# Patient Record
Sex: Female | Born: 1960 | Race: White | Hispanic: No | Marital: Married | Smoking: Former smoker
Health system: Southern US, Community
[De-identification: ages and names within clinical notes are randomized; demographics above are authoritative.]

## PROBLEM LIST (undated history)

## (undated) DIAGNOSIS — G43909 Migraine, unspecified, not intractable, without status migrainosus: Secondary | ICD-10-CM

## (undated) DIAGNOSIS — M797 Fibromyalgia: Secondary | ICD-10-CM

## (undated) DIAGNOSIS — E039 Hypothyroidism, unspecified: Secondary | ICD-10-CM

## (undated) DIAGNOSIS — E785 Hyperlipidemia, unspecified: Secondary | ICD-10-CM

## (undated) DIAGNOSIS — J3089 Other allergic rhinitis: Secondary | ICD-10-CM

## (undated) DIAGNOSIS — Z862 Personal history of diseases of the blood and blood-forming organs and certain disorders involving the immune mechanism: Secondary | ICD-10-CM

## (undated) DIAGNOSIS — I10 Essential (primary) hypertension: Secondary | ICD-10-CM

## (undated) DIAGNOSIS — Z8639 Personal history of other endocrine, nutritional and metabolic disease: Secondary | ICD-10-CM

## (undated) HISTORY — DX: Essential (primary) hypertension: I10

## (undated) HISTORY — PX: TUBAL LIGATION: SHX77

## (undated) HISTORY — DX: Hypothyroidism, unspecified: E03.9

## (undated) HISTORY — DX: Personal history of diseases of the blood and blood-forming organs and certain disorders involving the immune mechanism: Z86.2

## (undated) HISTORY — DX: Fibromyalgia: M79.7

## (undated) HISTORY — PX: ABDOMINAL HYSTERECTOMY: SHX81

## (undated) HISTORY — DX: Other allergic rhinitis: J30.89

## (undated) HISTORY — DX: Hyperlipidemia, unspecified: E78.5

## (undated) HISTORY — DX: Migraine, unspecified, not intractable, without status migrainosus: G43.909

## (undated) HISTORY — DX: Personal history of other endocrine, nutritional and metabolic disease: Z86.39

---

## 1987-11-30 HISTORY — PX: CHOLECYSTECTOMY: SHX55

## 2005-11-29 DIAGNOSIS — Z862 Personal history of diseases of the blood and blood-forming organs and certain disorders involving the immune mechanism: Secondary | ICD-10-CM

## 2005-11-29 HISTORY — DX: Personal history of diseases of the blood and blood-forming organs and certain disorders involving the immune mechanism: Z86.2

## 2005-11-29 HISTORY — PX: LAPAROSCOPIC HYSTERECTOMY: SHX1926

## 2010-11-29 DIAGNOSIS — M797 Fibromyalgia: Secondary | ICD-10-CM

## 2010-11-29 HISTORY — DX: Fibromyalgia: M79.7

## 2012-08-23 ENCOUNTER — Encounter: Payer: Self-pay | Admitting: Family Medicine

## 2012-08-23 ENCOUNTER — Other Ambulatory Visit: Payer: Self-pay | Admitting: Family Medicine

## 2012-08-23 ENCOUNTER — Ambulatory Visit (INDEPENDENT_AMBULATORY_CARE_PROVIDER_SITE_OTHER): Payer: PRIVATE HEALTH INSURANCE | Admitting: Family Medicine

## 2012-08-23 VITALS — BP 144/90 | HR 96 | Temp 98.2°F | Ht 66.5 in | Wt 248.0 lb

## 2012-08-23 DIAGNOSIS — E039 Hypothyroidism, unspecified: Secondary | ICD-10-CM

## 2012-08-23 DIAGNOSIS — I10 Essential (primary) hypertension: Secondary | ICD-10-CM

## 2012-08-23 DIAGNOSIS — D571 Sickle-cell disease without crisis: Secondary | ICD-10-CM

## 2012-08-23 DIAGNOSIS — IMO0001 Reserved for inherently not codable concepts without codable children: Secondary | ICD-10-CM

## 2012-08-23 DIAGNOSIS — G43909 Migraine, unspecified, not intractable, without status migrainosus: Secondary | ICD-10-CM | POA: Insufficient documentation

## 2012-08-23 DIAGNOSIS — J309 Allergic rhinitis, unspecified: Secondary | ICD-10-CM

## 2012-08-23 DIAGNOSIS — M797 Fibromyalgia: Secondary | ICD-10-CM

## 2012-08-23 DIAGNOSIS — D649 Anemia, unspecified: Secondary | ICD-10-CM | POA: Insufficient documentation

## 2012-08-23 DIAGNOSIS — J3089 Other allergic rhinitis: Secondary | ICD-10-CM

## 2012-08-23 DIAGNOSIS — E1165 Type 2 diabetes mellitus with hyperglycemia: Secondary | ICD-10-CM

## 2012-08-23 LAB — MICROALBUMIN / CREATININE URINE RATIO
Creatinine,U: 216.8 mg/dL
Microalb Creat Ratio: 4.4 mg/g (ref 0.0–30.0)
Microalb, Ur: 9.5 mg/dL — ABNORMAL HIGH (ref 0.0–1.9)

## 2012-08-23 LAB — BASIC METABOLIC PANEL
GFR: 89.58 mL/min (ref >60.00)
Glucose, Bld: 204 mg/dL — ABNORMAL HIGH (ref 70–99)
Potassium: 4.1 mEq/L (ref 3.5–5.1)
Sodium: 139 mEq/L (ref 135–145)

## 2012-08-23 LAB — HEMOGLOBIN A1C: Hgb A1c MFr Bld: 7.4 % — ABNORMAL HIGH (ref 4.6–6.5)

## 2012-08-23 MED ORDER — METFORMIN HCL 500 MG PO TABS: 500.0000 mg | ORAL_TABLET | Freq: Every day | ORAL | Status: DC

## 2012-08-23 NOTE — Assessment & Plan Note (Signed)
Check A1c today as well as microalb today.   Requested records today. Foot exam today. Encouraged to schedule vision screen. I've asked her to keep track of blood sugars for 1 week prior to next appt and bring log in.

## 2012-08-23 NOTE — Patient Instructions (Signed)
Good to meet you today. Call us with questions. Return in 1 month for physical. Make sure you sign release of info for records from [place]. Blood work today.

## 2012-08-23 NOTE — Assessment & Plan Note (Signed)
Await records.  ??dx.

## 2012-08-23 NOTE — Assessment & Plan Note (Signed)
Stable off meds. Tried cymbalta in past. Await records from prior provider.

## 2012-08-23 NOTE — Assessment & Plan Note (Signed)
Check TSH today

## 2012-08-23 NOTE — Assessment & Plan Note (Signed)
Await records from prior PCP. 

## 2012-08-23 NOTE — Assessment & Plan Note (Signed)
Chronic, stable. Continue lisinopril. Pt states at home significantly better control than in office, endorses white coat hypertension. No changes today.

## 2012-08-23 NOTE — Progress Notes (Signed)
Subjective:    Patient ID: Cheryl Brady, female    DOB: 07-30-1961, 51 y.o.   MRN: [id number]  HPI CC: new pt to establish  Moved 5 wks ago, from [place] [employer]  HTN - tolerate lisinopril 20mg  daily - uses brand.  At home bp runs 110/70s.  Hypothyroidism - on synthroid brand daily.  Dx 2009.  Silent migraines - dizzy and nauseated, photophobia.  Treats alka seltzer.  Gets 1x/wk.  Fibromyalgia - currently off meds.  Tried cymbalta.  DM - dx 2006.  Recently moved so hasn't been checking sugars.  Was running 140s fasting.  Last eye exam - about 4 years.  No h/o foot exam in past.  Preventative: Last CPE 4 yrs ago. Mammogram - 3 yrs ago, normal. Declines flu shot. Tetanus shot - about 2001.  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  Past histories reviewed and updated if relevant as below. Patient Active Problem List  Diagnosis  . Sickle cell anemia  . Diabetes type 2, uncontrolled  . Perennial allergic rhinitis  . Hypertension  . Migraines  . Hypothyroid  . Fibromyalgia   Past Medical History  Diagnosis Date  . Sickle cell anemia 2007    dx at hysterectomy (per pt)  . Diabetes type 2, uncontrolled ~2006  . Perennial allergic rhinitis   . Hypertension   . Migraines     silent migraines - dizzy and nauseated, photophobia  . Hypothyroid   . Fibromyalgia    Past Surgical History  Procedure Date  . Cholecystectomy 1989  . Laparoscopic hysterectomy 2007    total, BSO  . Tubal ligation    History  Substance Use Topics  . Smoking status: Former Games developer  . Smokeless tobacco: Never Used  . Alcohol Use: No   Family History  Problem Relation Age of Onset  . Stroke Mother   . Coronary artery disease Father   . Stroke Father    . Diabetes Neg Hx   . Cancer Neg Hx    Allergies  Allergen Reactions  . Cymbalta (Duloxetine Hcl) Nausea Only  . Adhesive (Tape) Rash  . Latex Rash   Current Outpatient Prescriptions on File Prior to Visit  Medication Sig Dispense Refill  . levothyroxine (SYNTHROID, LEVOTHROID) 125 MCG tablet Take 125 mcg by mouth daily.      Marland Kitchen lisinopril (PRINIVIL,ZESTRIL) 20 MG tablet Take 20 mg by mouth daily.        Review of Systems  Constitutional: Negative for fever, chills, activity change, appetite change, fatigue and unexpected weight change.  HENT: Negative for hearing loss and neck pain.   Eyes: Negative for visual disturbance.  Respiratory: Positive for cough and wheezing. Negative for chest tightness and shortness of breath.   Cardiovascular: Negative for chest pain, palpitations and leg swelling.  Gastrointestinal: Positive for abdominal pain (LLQ). Negative for nausea, vomiting, diarrhea, constipation, blood in stool and abdominal distention.  Genitourinary: Negative for hematuria and difficulty urinating.  Musculoskeletal: Negative for myalgias and arthralgias.  Skin: Negative for rash.  Neurological: Positive for headaches. Negative for dizziness, seizures and syncope.  Hematological: Does not bruise/bleed easily.  Psychiatric/Behavioral: Negative for dysphoric mood. The patient is not nervous/anxious.        Objective:   Physical Exam  Nursing note and vitals reviewed. Constitutional: She is oriented  to person, place, and time. She appears well-developed and well-nourished. No distress.  HENT:  Head: Normocephalic and atraumatic.  Right Ear: External ear normal.  Left Ear: External ear normal.  Nose: Nose normal.  Mouth/Throat: Oropharynx is clear and moist. No oropharyngeal exudate.  Eyes: Conjunctivae normal and EOM are normal. Pupils are equal, round, and reactive to light. No scleral icterus.  Neck: Normal range of motion. Neck supple. Carotid bruit is not present.  No thyromegaly present.  Cardiovascular: Normal rate, regular rhythm, normal heart sounds and intact distal pulses.   No murmur heard. Pulses:      Radial pulses are 2+ on the right side, and 2+ on the left side.  Pulmonary/Chest: Effort normal and breath sounds normal. No respiratory distress. She has no wheezes. She has no rales.  Abdominal: Soft. Bowel sounds are normal. She exhibits no distension and no mass. There is no tenderness. There is no rebound and no guarding.  Musculoskeletal: Normal range of motion. She exhibits no edema.       Diabetic foot exam: Normal inspection No skin breakdown No calluses  Normal DP/PT pulses Normal sensation to light touch and monofilament Nails normal   Lymphadenopathy:    She has no cervical adenopathy.  Neurological: She is alert and oriented to person, place, and time.       CN grossly intact, station and gait intact  Skin: Skin is warm and dry. No rash noted.  Psychiatric: She has a normal mood and affect. Her behavior is normal. Judgment and thought content normal.      Assessment & Plan:

## 2012-09-21 ENCOUNTER — Encounter: Payer: Self-pay | Admitting: Family Medicine

## 2012-09-21 ENCOUNTER — Ambulatory Visit (INDEPENDENT_AMBULATORY_CARE_PROVIDER_SITE_OTHER): Payer: PRIVATE HEALTH INSURANCE | Admitting: Family Medicine

## 2012-09-21 VITALS — BP 136/98 | HR 76 | Temp 98.2°F | Ht 66.5 in | Wt 243.2 lb

## 2012-09-21 DIAGNOSIS — Z1231 Encounter for screening mammogram for malignant neoplasm of breast: Secondary | ICD-10-CM

## 2012-09-21 DIAGNOSIS — I10 Essential (primary) hypertension: Secondary | ICD-10-CM

## 2012-09-21 DIAGNOSIS — E1165 Type 2 diabetes mellitus with hyperglycemia: Secondary | ICD-10-CM

## 2012-09-21 DIAGNOSIS — IMO0002 Reserved for concepts with insufficient information to code with codable children: Secondary | ICD-10-CM

## 2012-09-21 DIAGNOSIS — E669 Obesity, unspecified: Secondary | ICD-10-CM

## 2012-09-21 DIAGNOSIS — E039 Hypothyroidism, unspecified: Secondary | ICD-10-CM

## 2012-09-21 DIAGNOSIS — D649 Anemia, unspecified: Secondary | ICD-10-CM

## 2012-09-21 DIAGNOSIS — Z1211 Encounter for screening for malignant neoplasm of colon: Secondary | ICD-10-CM

## 2012-09-21 DIAGNOSIS — Z0000 Encounter for general adult medical examination without abnormal findings: Secondary | ICD-10-CM

## 2012-09-21 DIAGNOSIS — Z23 Encounter for immunization: Secondary | ICD-10-CM

## 2012-09-21 LAB — CBC WITH DIFFERENTIAL/PLATELET
Basophils Relative: 0.4 % (ref 0.0–3.0)
Eosinophils Relative: 3.3 % (ref 0.0–5.0)
HCT: 48.1 % — ABNORMAL HIGH (ref 36.0–46.0)
Hemoglobin: 16 g/dL — ABNORMAL HIGH (ref 12.0–15.0)
Lymphs Abs: 2.9 10*3/uL (ref 0.7–4.0)
MCV: 90.7 fl (ref 78.0–100.0)
Monocytes Absolute: 0.8 10*3/uL (ref 0.1–1.0)
Neutro Abs: 5.7 10*3/uL (ref 1.4–7.7)
RBC: 5.3 Mil/uL — ABNORMAL HIGH (ref 3.87–5.11)
WBC: 9.7 10*3/uL (ref 4.5–10.5)

## 2012-09-21 LAB — LIPID PANEL: Triglycerides: 171 mg/dL — ABNORMAL HIGH (ref 0.0–149.0)

## 2012-09-21 MED ORDER — GLUCOPHAGE XR 500 MG PO TB24: 500.0000 mg | ORAL_TABLET | Freq: Every day | ORAL | Status: DC

## 2012-09-21 NOTE — Assessment & Plan Note (Signed)
Chronic. Did not tolerate IR metformin.  Trial of XR variety. rtc 3-4 mo for f/u DM.

## 2012-09-21 NOTE — Assessment & Plan Note (Signed)
Preventative protocols reviewed and updated unless pt declined. Discussed healthy diet and lifestyle.  

## 2012-09-21 NOTE — Assessment & Plan Note (Signed)
Chronic. Elevated today.  Anticipate improved control with weight loss - continue to monitor.  Endorses white coat HTN.

## 2012-09-21 NOTE — Assessment & Plan Note (Signed)
Awaiting records.  Check CBC today.

## 2012-09-21 NOTE — Progress Notes (Signed)
Subjective:    Patient ID: Cheryl Brady, female    DOB: March 20, 1961, 51 y.o.   MRN: [id number]  HPI CC: CPE  DM - did not tolerate metformin IR so will try extended release metformin.  Sugars averaging 100-150s.  Checks twice daily, fasting and prior to bedtime.  No hypoglycemia. Lab Results  Component Value Date   HGBA1C 7.4* 08/23/2012   Wt Readings from Last 3 Encounters:  09/21/12 243 lb 4 oz (110.337 kg)  08/23/12 248 lb (112.492 kg)  5lb weight loss noted!  staying more active, healthier diet.  Preventative:  Mammogram - 3 yrs ago, normal.  Would like [place] to schedule in 2014. Colon cancer - would like colonoscopy scheduled in 2014. Declines flu shot. Declines pneumovax. Tetanus shot - would like today.  Tdap. Well woman - sees Dr. Tawanna Cooler for OBGYN.  Has had normal breast exam/pap in past.  Sunscreen use discussed. Seatbelt use discussed.  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  Past histories reviewed and updated if relevant as below. Patient Active Problem List  Diagnosis  . Anemia  . Diabetes type 2, uncontrolled  . Perennial allergic rhinitis  . Hypertension  . Migraines  . Hypothyroid  . Fibromyalgia  . Healthcare maintenance   Past Medical History  Diagnosis Date  . Sickle cell anemia 2007    dx at hysterectomy (per pt)  . Diabetes type 2, uncontrolled ~2006  . Perennial allergic rhinitis   . Hypertension   . Migraines     silent migraines - dizzy and nauseated, photophobia  . Hypothyroid   . Fibromyalgia    Past Surgical History  Procedure Date  . Cholecystectomy 1989  . Laparoscopic hysterectomy 2007    total, BSO, for heavy bleeding  . Tubal ligation    History  Substance Use Topics  . Smoking status: Former Games developer  . Smokeless  tobacco: Never Used  . Alcohol Use: No   Family History  Problem Relation Age of Onset  . Stroke Mother   . Coronary artery disease Father   . Stroke Father   . Diabetes Neg Hx   . Cancer Neg Hx    Allergies  Allergen Reactions  . Cymbalta (Duloxetine Hcl) Nausea Only  . Metformin And Related     Nausea and diarrhea  . Adhesive (Tape) Rash  . Latex Rash   Current Outpatient Prescriptions on File Prior to Visit  Medication Sig Dispense Refill  . levothyroxine (SYNTHROID, LEVOTHROID) 125 MCG tablet Take 125 mcg by mouth daily.      Marland Kitchen lisinopril (PRINIVIL,ZESTRIL) 20 MG tablet Take 20 mg by mouth daily.         Review of Systems  Constitutional: Negative for fever, chills, activity change, appetite change, fatigue and unexpected weight change.  HENT: Negative for hearing loss and neck pain.   Eyes: Negative for visual disturbance.  Respiratory: Negative for cough, chest tightness, shortness of breath and wheezing.   Cardiovascular: Negative for chest pain, palpitations and leg swelling.  Gastrointestinal: Negative for nausea, vomiting, abdominal pain, diarrhea, constipation, blood in stool and abdominal distention.  Genitourinary: Negative for hematuria and difficulty urinating.  Musculoskeletal: Negative for myalgias and arthralgias.  Skin: Negative for rash.  Neurological: Negative for dizziness, seizures and syncope.  Hematological: Does not bruise/bleed easily.  Psychiatric/Behavioral: Negative for dysphoric mood. The patient  is not nervous/anxious.        Objective:   Physical Exam  Nursing note and vitals reviewed. Constitutional: She is oriented to person, place, and time. She appears well-developed and well-nourished. No distress.  HENT:  Head: Normocephalic and atraumatic.  Right Ear: External ear normal.  Left Ear: External ear normal.  Nose: Nose normal.  Mouth/Throat: Oropharynx is clear and moist. No oropharyngeal exudate.  Eyes: Conjunctivae normal and  EOM are normal. Pupils are equal, round, and reactive to light. No scleral icterus.  Neck: Normal range of motion. Neck supple.  Cardiovascular: Normal rate, regular rhythm, normal heart sounds and intact distal pulses.   No murmur heard. Pulses:      Radial pulses are 2+ on the right side, and 2+ on the left side.  Pulmonary/Chest: Effort normal and breath sounds normal. No respiratory distress. She has no wheezes. She has no rales.       deferred  Abdominal: Soft. Bowel sounds are normal. She exhibits no distension and no mass. There is no tenderness. There is no rebound and no guarding.  Genitourinary:       deferred  Musculoskeletal: Normal range of motion. She exhibits no edema.  Lymphadenopathy:    She has no cervical adenopathy.  Neurological: She is alert and oriented to person, place, and time.       CN grossly intact, station and gait intact  Skin: Skin is warm and dry. No rash noted.  Psychiatric: She has a normal mood and affect. Her behavior is normal. Judgment and thought content normal.       Assessment & Plan:

## 2012-09-21 NOTE — Addendum Note (Signed)
Addended by: Josph Macho A on: 09/21/2012 11:52 AM   Modules accepted: Orders

## 2012-09-21 NOTE — Patient Instructions (Addendum)
Try extended release metformin xr 500mg  daily.  If unable to tolerate, let me know. Pass by Marion's office to schedule mammogram and colonoscopy after first of the new year. Tdap today. Blood work today to check cholesterol and blood counts. Schedule well woman exam with obgyn if due.

## 2012-09-21 NOTE — Assessment & Plan Note (Signed)
Good control on current dose. Continue. Lab Results  Component Value Date   TSH 3.32 08/23/2012

## 2012-09-21 NOTE — Assessment & Plan Note (Signed)
Congratulated on weight loss, continue to encourage this through healthy diet/lifestyle changes.

## 2012-09-22 ENCOUNTER — Encounter: Payer: Self-pay | Admitting: *Deleted

## 2012-09-22 ENCOUNTER — Encounter: Payer: Self-pay | Admitting: Family Medicine

## 2012-09-22 DIAGNOSIS — E785 Hyperlipidemia, unspecified: Secondary | ICD-10-CM | POA: Insufficient documentation

## 2012-11-23 ENCOUNTER — Other Ambulatory Visit: Payer: Self-pay

## 2012-11-23 MED ORDER — LISINOPRIL 20 MG PO TABS: 20.0000 mg | ORAL_TABLET | Freq: Every day | ORAL | Status: DC

## 2012-11-23 NOTE — Telephone Encounter (Signed)
pts husband request refill lisinopril 20 mg. Advised done #30 x 6 to [facility] [address].

## 2013-01-09 ENCOUNTER — Encounter: Payer: Self-pay | Admitting: Gastroenterology

## 2013-01-22 ENCOUNTER — Ambulatory Visit: Payer: PRIVATE HEALTH INSURANCE | Admitting: Family Medicine

## 2013-01-22 DIAGNOSIS — Z0289 Encounter for other administrative examinations: Secondary | ICD-10-CM

## 2013-01-25 ENCOUNTER — Other Ambulatory Visit: Payer: Self-pay

## 2013-01-25 MED ORDER — LEVOTHYROXINE SODIUM 125 MCG PO TABS: 125.0000 ug | ORAL_TABLET | Freq: Every day | ORAL | Status: DC

## 2013-01-25 NOTE — Telephone Encounter (Signed)
pts husband request refill Levothyroxine # 90 x 1 to [facility] [address]. Advised done.

## 2013-02-14 ENCOUNTER — Other Ambulatory Visit: Payer: PRIVATE HEALTH INSURANCE | Admitting: Gastroenterology

## 2013-03-27 ENCOUNTER — Other Ambulatory Visit: Payer: PRIVATE HEALTH INSURANCE | Admitting: Gastroenterology

## 2013-04-03 ENCOUNTER — Telehealth: Payer: Self-pay

## 2013-04-03 NOTE — Telephone Encounter (Signed)
Pt request refill on diabetic test strips,lisinopril and synthroid; pt is not out of med; pt was no show for 01/22/13 f/u visit. Pt scheduled appt on 04/06/13 at 3:45 pm.(1st appt convenient for pt schedule)the patient will bring meds with her to visit.

## 2013-04-06 ENCOUNTER — Ambulatory Visit (INDEPENDENT_AMBULATORY_CARE_PROVIDER_SITE_OTHER): Payer: PRIVATE HEALTH INSURANCE | Admitting: Family Medicine

## 2013-04-06 ENCOUNTER — Encounter: Payer: Self-pay | Admitting: Family Medicine

## 2013-04-06 VITALS — BP 132/84 | HR 68 | Temp 98.5°F | Wt 239.0 lb

## 2013-04-06 DIAGNOSIS — I10 Essential (primary) hypertension: Secondary | ICD-10-CM

## 2013-04-06 DIAGNOSIS — E669 Obesity, unspecified: Secondary | ICD-10-CM

## 2013-04-06 DIAGNOSIS — E1165 Type 2 diabetes mellitus with hyperglycemia: Secondary | ICD-10-CM

## 2013-04-06 DIAGNOSIS — E785 Hyperlipidemia, unspecified: Secondary | ICD-10-CM

## 2013-04-06 NOTE — Patient Instructions (Addendum)
[facility] with [facility] about eye exam. Pass by Marion's office to schedule diabetes education at [facility]. Start [place] 50mg  daily.  If tolerating well, after 2 weeks increase to 100mg  daily. Blood work today. Cholesterol levels were high last visit - we may need to discuss chol medication at follow up visit.

## 2013-04-06 NOTE — Progress Notes (Signed)
  Subjective:    Patient ID: Analia Zuk, female    DOB: Jun 22, 1961, 52 y.o.   MRN: [id number]  HPI CC: DM f/u  DM - did not tolerate metformin IR so tried extended release metformin - same trouble.  tried this for 2 weeks.  Checks twice daily, fasting and prior to bedtime. fasting 190s, PM 135.  No hypoglycemia sxs or low sugars.  Last eye exam 3+ yrs ago.  Foot exam - today.  Poor dietary choices.    Lab Results  Component Value Date   HGBA1C 7.4* 08/23/2012   Wt Readings from Last 3 Encounters:  04/06/13 239 lb (108.41 kg)  09/21/12 243 lb 4 oz (110.337 kg)  08/23/12 248 lb (112.492 kg)  Trying low carb diet. Looking into Y. Backed off walking 2/2 bugs.  HTN - No HA, vision changes, CP/tightness, SOB, leg swelling.  ALS- father, paternal aunt, and 2 cousins with ALS.  3rd cousin recently dx (age 29yo).  Pt is ex smoker (quit 15 yrs ago).  Past Medical History  Diagnosis Date  . Sickle cell anemia 2007    ??dx at hysterectomy (per pt)  likely just anemia, awaiting records.  . Diabetes type 2, uncontrolled ~2006  . Perennial allergic rhinitis   . Hypertension   . Migraines     silent migraines - dizzy and nauseated, photophobia  . Hypothyroid   . Fibromyalgia   . Hyperlipidemia     Review of Systems Per HPI    Objective:   Physical Exam  Nursing note and vitals reviewed. Constitutional: She appears well-developed and well-nourished. No distress.  HENT:  Head: Normocephalic and atraumatic.  Nose: Nose normal.  Mouth/Throat: Oropharynx is clear and moist. No oropharyngeal exudate.  Eyes: Conjunctivae and EOM are normal. Pupils are equal, round, and reactive to light. No scleral icterus.  Neck: Normal range of motion. Neck supple.  Cardiovascular: Normal rate, regular rhythm, normal heart sounds and intact distal pulses.   No murmur heard. Pulmonary/Chest: Effort normal and breath sounds normal. No respiratory distress. She has no wheezes. She has no rales.   Musculoskeletal: She exhibits no edema.  Diabetic foot exam: Normal inspection No skin breakdown No calluses  Normal DP/PT pulses Normal sensation to light touch and monofilament Nails normal  Lymphadenopathy:    She has no cervical adenopathy.  Skin: Skin is warm and dry. No rash noted.  Psychiatric: She has a normal mood and affect.      Assessment & Plan:

## 2013-04-06 NOTE — Assessment & Plan Note (Signed)
Uncontrolled as of last check , will need to discuss statin at f/u visit.

## 2013-04-06 NOTE — Assessment & Plan Note (Signed)
Chronic, stable. continue ACEI.

## 2013-04-06 NOTE — Assessment & Plan Note (Signed)
Congratulated on weight loss - keep up good work.

## 2013-04-06 NOTE — Assessment & Plan Note (Signed)
Chronic, uncontrolled. Did not tolerate extended release metformin. Start [place] 50mg  daily, increase to 100mg  if tolerated. rtc 3 mo for DM f/u. Set up eye exam. Foot exam today. Set up diabetes education at [facility].

## 2013-04-07 ENCOUNTER — Emergency Department: Payer: Self-pay | Admitting: Emergency Medicine

## 2013-04-07 LAB — BASIC METABOLIC PANEL
BUN: 13 mg/dL (ref 6–23)
CO2: 20 mEq/L (ref 19–32)
Calcium: 10.6 mg/dL — ABNORMAL HIGH (ref 8.4–10.5)
Chloride: 105 mEq/L (ref 96–112)
Creat: 0.75 mg/dL (ref 0.50–1.10)
Glucose, Bld: 134 mg/dL — ABNORMAL HIGH (ref 70–99)
Potassium: 4.1 mEq/L (ref 3.5–5.3)
Sodium: 140 mEq/L (ref 135–145)

## 2013-04-08 ENCOUNTER — Encounter: Payer: Self-pay | Admitting: Family Medicine

## 2013-04-09 ENCOUNTER — Encounter: Payer: Self-pay | Admitting: *Deleted

## 2013-04-09 ENCOUNTER — Telehealth: Payer: Self-pay

## 2013-04-09 ENCOUNTER — Encounter: Payer: Self-pay | Admitting: Family Medicine

## 2013-04-09 NOTE — Telephone Encounter (Signed)
Pt husband left v/m; pt seen 04/06/13 and was to call back name of glucose meter for refills Freestyle Freedom Lite. Pt request 90 day with 1 year refill to [facility]. Pt test twice daily.

## 2013-04-10 MED ORDER — GLUCOSE BLOOD VI STRP: 1.0000 | ORAL_STRIP | Freq: Every day | Status: DC

## 2013-04-10 MED ORDER — GLUCOSE BLOOD VI STRP: 1.0000 | ORAL_STRIP | Freq: Two times a day (BID) | Status: DC

## 2013-04-10 NOTE — Telephone Encounter (Signed)
Sent in as requested 

## 2013-04-13 ENCOUNTER — Telehealth: Payer: Self-pay

## 2013-04-13 MED ORDER — SITAGLIPTIN PHOSPHATE 50 MG PO TABS: 50.0000 mg | ORAL_TABLET | Freq: Every day | ORAL | Status: DC

## 2013-04-13 NOTE — Telephone Encounter (Signed)
pts husband left v/m Januvia prescription was to be sent to [facility] [address]; no rx at pharmacy.Please advise.

## 2013-04-13 NOTE — Telephone Encounter (Signed)
Informed pts husband

## 2013-04-13 NOTE — Telephone Encounter (Signed)
i'm sorry i didn't send in.  plz notify this has been sent in.

## 2013-04-25 ENCOUNTER — Other Ambulatory Visit: Payer: Self-pay | Admitting: Family Medicine

## 2013-04-25 MED ORDER — LEVOTHYROXINE SODIUM 125 MCG PO TABS: 125.0000 ug | ORAL_TABLET | Freq: Every day | ORAL | Status: DC

## 2013-04-27 ENCOUNTER — Telehealth: Payer: Self-pay

## 2013-04-27 DIAGNOSIS — E039 Hypothyroidism, unspecified: Secondary | ICD-10-CM

## 2013-04-27 NOTE — Telephone Encounter (Signed)
pts husband left v/m [facility] [address] does not have levothyroxine refill. Spoke with Hope at [facility] [address] and levothyroxine on hold but has 2 addresses for pt. Hope will call pt. I left v/m for Mr Music to advise also.

## 2013-05-02 NOTE — Telephone Encounter (Signed)
Ok to switch if pt agrees - but will need to come in 6 wks after switch and check TSH.  Placed order in chart.

## 2013-05-02 NOTE — Addendum Note (Signed)
Addended by: Eustaquio Boyden on: 05/02/2013 07:16 PM   Modules accepted: Orders

## 2013-05-02 NOTE — Telephone Encounter (Signed)
Pts husband said [facility] will not release levothyroxine; spoke with Neysa Bonito at [facility] [address] and issue of 2 addresses has been settled and pt has one acct now. Neysa Bonito said needs OK to switch pt from [employer] to [facility] brand. Please advise. Marcial Pacas, pt's husband request cb when done.

## 2013-05-03 ENCOUNTER — Other Ambulatory Visit: Payer: Self-pay

## 2013-05-03 MED ORDER — GLUCOPHAGE XR 500 MG PO TB24: 500.0000 mg | ORAL_TABLET | Freq: Every day | ORAL | Status: DC

## 2013-05-03 MED ORDER — LEVOTHYROXINE SODIUM 125 MCG PO TABS: 125.0000 ug | ORAL_TABLET | Freq: Every day | ORAL | Status: DC

## 2013-05-03 MED ORDER — SITAGLIPTIN PHOSPHATE 50 MG PO TABS: 50.0000 mg | ORAL_TABLET | Freq: Every day | ORAL | Status: DC

## 2013-05-03 MED ORDER — LISINOPRIL 20 MG PO TABS: 20.0000 mg | ORAL_TABLET | Freq: Every day | ORAL | Status: DC

## 2013-05-03 NOTE — Telephone Encounter (Signed)
Gave the okay to [facility] and notified pt's spouse, lab appt scheduled for 06/12/13 to check pt's TSH

## 2013-05-03 NOTE — Telephone Encounter (Signed)
Mr Windish request written rx for Glucophage, levothyroxine, lisinopril and Januvia manually faxed to [facility] fax # [phone]. Pt said [facility] mail order in our system is not where pts refills should go; glucose test strip order still in process. Mr Dhaliwal request cb when refills done.

## 2013-05-03 NOTE — Telephone Encounter (Signed)
Printed and placed in kim[facility] box 

## 2013-06-07 ENCOUNTER — Other Ambulatory Visit: Payer: Self-pay

## 2013-06-11 ENCOUNTER — Ambulatory Visit (INDEPENDENT_AMBULATORY_CARE_PROVIDER_SITE_OTHER): Payer: PRIVATE HEALTH INSURANCE | Admitting: Family Medicine

## 2013-06-11 ENCOUNTER — Other Ambulatory Visit: Payer: PRIVATE HEALTH INSURANCE

## 2013-06-11 ENCOUNTER — Encounter: Payer: Self-pay | Admitting: Family Medicine

## 2013-06-11 VITALS — BP 128/76 | HR 72 | Temp 98.0°F | Wt 243.8 lb

## 2013-06-11 DIAGNOSIS — E1165 Type 2 diabetes mellitus with hyperglycemia: Secondary | ICD-10-CM

## 2013-06-11 DIAGNOSIS — T148XXA Other injury of unspecified body region, initial encounter: Secondary | ICD-10-CM

## 2013-06-11 DIAGNOSIS — E039 Hypothyroidism, unspecified: Secondary | ICD-10-CM

## 2013-06-11 NOTE — Assessment & Plan Note (Signed)
Reassured.  Wrap with ace bandage in office. supportive care as per instructions Anticipate 4-6 wk course of resolution, update if not improving as expected

## 2013-06-11 NOTE — Progress Notes (Signed)
  Subjective:    Patient ID: Cheryl Brady, female    DOB: 1961-09-27, 52 y.o.   MRN: [id number]  HPI CC: L leg pain  Some issues with getting meds filled through [facility] in [place].  To start [place], just received med on Friday.  Does not have f/u appt for diabetes yet.  Endorses lots of stress eating - recent vacation and daughter's wedding.   L leg pain - 2 wks ago in pool, climbing out of ladder, hit anterior shin on ladder wrung.  Bruise noted anterior leg, spread to foot.  Swelling improving.  Has been using ice to leg.  Hypothyroidism - due for recheck today - missed 5 days but took 1/2 of husband's med. Lab Results  Component Value Date   TSH 3.32 08/23/2012     Past Medical History  Diagnosis Date  . Sickle cell anemia 2007    ??dx at hysterectomy (per pt)  likely just anemia, awaiting records.  . Diabetes type 2, uncontrolled ~2006    [facility] 03/2013  . Perennial allergic rhinitis   . Hypertension   . Migraines     silent migraines - dizzy and nauseated, photophobia  . Hypothyroid   . Fibromyalgia   . Hyperlipidemia      Review of Systems Per HPI    Objective:   Physical Exam  Nursing note and vitals reviewed. Constitutional: She appears well-developed and well-nourished. No distress.  Musculoskeletal: She exhibits no edema.  Diabetic foot exam: Normal inspection No skin breakdown No calluses  Normal DP/PT pulses Normal sensation to light touch and monofilament Nails normal  Resolving bruise on left anterior shin. Also tender at left lateral ankle but no ligament laxity.  Skin: Skin is warm and dry. No rash noted.  Bruising on anterior L shin       Assessment & Plan:

## 2013-06-11 NOTE — Assessment & Plan Note (Signed)
Just started [place] 50mg  daily. Lab Results  Component Value Date   HGBA1C 7.4* 04/06/2013  Foot exam today RTC 3 mo for DM f/u.

## 2013-06-11 NOTE — Assessment & Plan Note (Signed)
Check TSH today

## 2013-06-11 NOTE — Patient Instructions (Signed)
Thyroid check today. I think you had a bony bruise - will take several weeks to improve.  Continue to elevate and ice.  Compression bandage done today. May continue ibuprofen or tylenol as needed. Return in 3 months for diabetes follow up.

## 2013-06-12 ENCOUNTER — Other Ambulatory Visit: Payer: Self-pay | Admitting: Family Medicine

## 2013-06-12 ENCOUNTER — Other Ambulatory Visit: Payer: PRIVATE HEALTH INSURANCE

## 2013-06-12 MED ORDER — LEVOTHYROXINE SODIUM 137 MCG PO TABS: 137.0000 ug | ORAL_TABLET | Freq: Every day | ORAL | Status: DC

## 2013-07-19 ENCOUNTER — Other Ambulatory Visit (INDEPENDENT_AMBULATORY_CARE_PROVIDER_SITE_OTHER): Payer: PRIVATE HEALTH INSURANCE

## 2013-07-19 DIAGNOSIS — E039 Hypothyroidism, unspecified: Secondary | ICD-10-CM

## 2013-07-20 ENCOUNTER — Encounter: Payer: Self-pay | Admitting: *Deleted

## 2013-07-20 ENCOUNTER — Other Ambulatory Visit: Payer: Self-pay | Admitting: *Deleted

## 2013-07-20 MED ORDER — LEVOTHYROXINE SODIUM 137 MCG PO TABS: 137.0000 ug | ORAL_TABLET | Freq: Every day | ORAL | Status: DC

## 2013-07-30 LAB — HM DIABETES EYE EXAM

## 2013-09-12 ENCOUNTER — Encounter: Payer: Self-pay | Admitting: Family Medicine

## 2013-09-12 ENCOUNTER — Ambulatory Visit (INDEPENDENT_AMBULATORY_CARE_PROVIDER_SITE_OTHER): Payer: PRIVATE HEALTH INSURANCE | Admitting: Family Medicine

## 2013-09-12 ENCOUNTER — Ambulatory Visit (INDEPENDENT_AMBULATORY_CARE_PROVIDER_SITE_OTHER)
Admission: RE | Admit: 2013-09-12 | Discharge: 2013-09-12 | Disposition: A | Discharge status: Home / Self Care | Payer: PRIVATE HEALTH INSURANCE | Source: Ambulatory Visit | Attending: Family Medicine | Admitting: Family Medicine

## 2013-09-12 VITALS — BP 136/90 | HR 80 | Temp 98.9°F | Wt 243.8 lb

## 2013-09-12 DIAGNOSIS — E1165 Type 2 diabetes mellitus with hyperglycemia: Secondary | ICD-10-CM

## 2013-09-12 DIAGNOSIS — M79605 Pain in left leg: Secondary | ICD-10-CM

## 2013-09-12 DIAGNOSIS — M79609 Pain in unspecified limb: Secondary | ICD-10-CM

## 2013-09-12 DIAGNOSIS — I10 Essential (primary) hypertension: Secondary | ICD-10-CM

## 2013-09-12 DIAGNOSIS — T148XXA Other injury of unspecified body region, initial encounter: Secondary | ICD-10-CM

## 2013-09-12 LAB — BASIC METABOLIC PANEL
BUN: 13 mg/dL (ref 6–23)
Calcium: 9.9 mg/dL (ref 8.4–10.5)
GFR: 90.64 mL/min (ref >60.00)
Glucose, Bld: 219 mg/dL — ABNORMAL HIGH (ref 70–99)
Sodium: 139 mEq/L (ref 135–145)

## 2013-09-12 MED ORDER — GLIMEPIRIDE 1 MG PO TABS: 1.0000 mg | ORAL_TABLET | Freq: Every day | ORAL | Status: DC

## 2013-09-12 MED ORDER — GLIMEPIRIDE 2 MG PO TABS: 2.0000 mg | ORAL_TABLET | Freq: Every day | ORAL | Status: DC

## 2013-09-12 NOTE — Assessment & Plan Note (Signed)
Persistently uncontrolled - previously did not tolerate [place] or metformin. Start amaryl low dose - discussed possible hypoglycemia.  Advised monitor sugars closely with commencement. Declines flu and pneumovax today. Lab Results  Component Value Date   HGBA1C 7.4* 04/06/2013  rtc 3 mo for dm f/u.

## 2013-09-12 NOTE — Assessment & Plan Note (Signed)
Chronic, stable. Continue acei.

## 2013-09-12 NOTE — Patient Instructions (Signed)
Start glimeperide 1mg  daily for 2 weeks then may increase to 2mg  daily if sugars staying elevated.   Keep a close eye on sugars while we start this medicine because they can drop some. Blood work today.  Xray today. Return in 3-4 months for fasting blood work and afterwards for physical

## 2013-09-12 NOTE — Assessment & Plan Note (Signed)
Persistent pain, ?paresthesias? Check xray today to r/o stress fracture or other etiology.

## 2013-09-12 NOTE — Progress Notes (Signed)
  Subjective:    Patient ID: Cheryl Brady, female    DOB: [birth year], 52 y.o.   MRN: [id number]  HPI CC: DM f/u  DM - Sugars running fasting 250.  Checking once daily, occasionally more.  Did not tolerate immediate or extended release metformin, did not tolerate [place].  Last eye appt was last month, normal per patient.  Foot exam today.   Lab Results  Component Value Date   HGBA1C 7.4* 04/06/2013     L leg pain - 05/2013 climbing out of pool ladder, hit anterior shin on ladder wrung. Bruise noted anterior leg, spread to foot. Swelling improving. Thought she had bony bruise - rec supportive care.  Persistent anterior shin pain as well as some tingling and numbness of dorsal distal midfoot.  HTN - lisinopril 20mg  daily. Hypothyroid - compliant with levothyroxine. Lab Results  Component Value Date   TSH 4.01 07/19/2013    Declines flu shot. Declines pneumovax.  Has had pneumonia in the past.  Past Medical History  Diagnosis Date  . Sickle cell anemia 2007    ??dx at hysterectomy (per pt)  likely just anemia, awaiting records.  . Diabetes type 2, uncontrolled ~2006    DSME [facility] 03/2013  . Perennial allergic rhinitis   . Hypertension   . Migraines     silent migraines - dizzy and nauseated, photophobia  . Hypothyroid   . Fibromyalgia   . Hyperlipidemia     Review of Systems Per HPI    Objective:   Physical Exam  Nursing note and vitals reviewed. Constitutional: She appears well-developed and well-nourished. No distress.  HENT:  Head: Normocephalic and atraumatic.  Mouth/Throat: Oropharynx is clear and moist. No oropharyngeal exudate.  Eyes: Conjunctivae and EOM are normal. Pupils are equal, round, and reactive to light. No scleral icterus.  Neck: Normal range of motion. Neck supple.  Cardiovascular: Normal rate, regular rhythm, normal heart sounds and intact distal pulses.   No murmur heard. Pulmonary/Chest: Effort normal and breath sounds normal. No respiratory distress.  She has no wheezes. She has no rales.  Musculoskeletal: She exhibits no edema.  Diabetic foot exam: Normal inspection No skin breakdown No calluses  Normal DP/PT pulses Normal sensation to light touch and monofilament Nails normal  Tenderness anterior left lower leg with some bruising. Tender to squeeze mid calf. No ligament laxity of ankle  Lymphadenopathy:    She has no cervical adenopathy.  Skin: Skin is warm and dry. No rash noted.  Psychiatric: She has a normal mood and affect.       Assessment & Plan:

## 2013-09-14 ENCOUNTER — Other Ambulatory Visit: Payer: Self-pay | Admitting: Family Medicine

## 2013-09-14 MED ORDER — GLUCOSE BLOOD VI STRP: 1.0000 | ORAL_STRIP | Freq: Two times a day (BID) | Status: DC

## 2013-12-19 ENCOUNTER — Ambulatory Visit: Payer: PRIVATE HEALTH INSURANCE | Admitting: Family Medicine

## 2014-01-01 ENCOUNTER — Encounter: Payer: Self-pay | Admitting: Family Medicine

## 2014-01-01 ENCOUNTER — Ambulatory Visit (INDEPENDENT_AMBULATORY_CARE_PROVIDER_SITE_OTHER)
Admission: RE | Admit: 2014-01-01 | Discharge: 2014-01-01 | Disposition: A | Discharge status: Home / Self Care | Payer: PRIVATE HEALTH INSURANCE | Source: Ambulatory Visit | Attending: Family Medicine | Admitting: Family Medicine

## 2014-01-01 ENCOUNTER — Ambulatory Visit: Payer: PRIVATE HEALTH INSURANCE | Admitting: Family Medicine

## 2014-01-01 ENCOUNTER — Ambulatory Visit (INDEPENDENT_AMBULATORY_CARE_PROVIDER_SITE_OTHER): Payer: PRIVATE HEALTH INSURANCE | Admitting: Family Medicine

## 2014-01-01 VITALS — BP 128/82 | HR 72 | Temp 97.9°F | Wt 245.8 lb

## 2014-01-01 DIAGNOSIS — M79609 Pain in unspecified limb: Secondary | ICD-10-CM

## 2014-01-01 DIAGNOSIS — M7752 Other enthesopathy of left foot: Secondary | ICD-10-CM | POA: Insufficient documentation

## 2014-01-01 DIAGNOSIS — M79672 Pain in left foot: Secondary | ICD-10-CM

## 2014-01-01 DIAGNOSIS — IMO0001 Reserved for inherently not codable concepts without codable children: Secondary | ICD-10-CM

## 2014-01-01 DIAGNOSIS — IMO0002 Reserved for concepts with insufficient information to code with codable children: Secondary | ICD-10-CM

## 2014-01-01 DIAGNOSIS — E1165 Type 2 diabetes mellitus with hyperglycemia: Secondary | ICD-10-CM

## 2014-01-01 MED ORDER — CANAGLIFLOZIN 100 MG PO TABS: 100.0000 mg | ORAL_TABLET | Freq: Every day | ORAL | Status: DC

## 2014-01-01 NOTE — Progress Notes (Signed)
Pre-visit discussion using our clinic review tool. No additional management support is needed unless otherwise documented below in the visit note.  

## 2014-01-01 NOTE — Patient Instructions (Signed)
Xray of left foot today - I wonder about a stress fracture. Placed in post op shoe today - use for next 4 weeks. Trial of invokana 100mg  daily - sent in today.  We will fill out prior authorization for invokana for you. Return in 1 month for follow up foot and diabetes.

## 2014-01-01 NOTE — Assessment & Plan Note (Signed)
Remains persistently uncontrolled. Has not tolerated januvia, metformin, now amaryl. Start invokana at 100mg  daily.  Discussed possible UTI/fungal infection risk Discussed insulin - pt states she is scared of needles. RTC 1 mo f/u foot and DM.

## 2014-01-01 NOTE — Progress Notes (Signed)
   Subjective:    Patient ID: Cheryl Brady, female    DOB: May 15, 1961, 53 y.o.   MRN: [id number]  HPI CC: 3 mo f/u  DM - Checking twice daily.  Morning running 213, PM 150 before dinner.  Did not tolerate immediate or extended release metformin, did not tolerate [place], has not tolerated amaryl (glimeperide). Last eye appt was 07/2013, normal per patient. Denies paresthesias.  + tingling in LLE - see below. Lab Results  Component Value Date   HGBA1C 8.1* 09/12/2013    Had accident July 4th where she waw climbing out of pool - left foot got stuck in ladder coming up and pt fell backwards.  Walking worsens pain.  Bruise comes and goes dorsal mid foot.    HTN - Compliant with current antihypertensive regimen of lisinopril 20mg  daily.  Does check blood pressures at home: 120-130/80.  No low blood pressure readings or symptoms of dizziness/syncope.  Denies HA, vision changes, CP/tightness, SOB.  Past Medical History  Diagnosis Date  . Sickle cell anemia 2007    ??dx at hysterectomy (per pt)  likely just anemia, never received records, no current anemia.  . Diabetes type 2, uncontrolled ~2006    DSME [facility] 03/2013  . Perennial allergic rhinitis   . Hypertension   . Migraines     silent migraines - dizzy and nauseated, photophobia  . Hypothyroid   . Fibromyalgia   . Hyperlipidemia     Review of Systems Per HPI    Objective:   Physical Exam  Nursing note and vitals reviewed. Constitutional: She appears well-developed and well-nourished. No distress.  HENT:  Mouth/Throat: Oropharynx is clear and moist. No oropharyngeal exudate.  Cardiovascular: Normal rate, regular rhythm, normal heart sounds and intact distal pulses.   No murmur heard. Pulmonary/Chest: Effort normal and breath sounds normal. No respiratory distress. She has no wheezes. She has no rales.  Musculoskeletal: She exhibits no edema.  2+ DP bilaterally Tender and some swelling noted at 2nd/3rd mid MT shafts         Assessment & Plan:

## 2014-01-01 NOTE — Assessment & Plan Note (Signed)
Ongoing pain for last 7 months. Concern for MT stress fracture on left - check xray today and place in post op shoe to prevent bending at midfoot. rtc 1 mo for f/u.

## 2014-01-30 ENCOUNTER — Encounter: Payer: Self-pay | Admitting: Family Medicine

## 2014-01-30 ENCOUNTER — Ambulatory Visit (INDEPENDENT_AMBULATORY_CARE_PROVIDER_SITE_OTHER): Payer: PRIVATE HEALTH INSURANCE | Admitting: Family Medicine

## 2014-01-30 ENCOUNTER — Other Ambulatory Visit: Payer: Self-pay | Admitting: Family Medicine

## 2014-01-30 ENCOUNTER — Telehealth: Payer: Self-pay | Admitting: Family Medicine

## 2014-01-30 VITALS — BP 130/84 | HR 80 | Temp 97.9°F | Wt 243.8 lb

## 2014-01-30 DIAGNOSIS — E785 Hyperlipidemia, unspecified: Secondary | ICD-10-CM

## 2014-01-30 DIAGNOSIS — E1165 Type 2 diabetes mellitus with hyperglycemia: Secondary | ICD-10-CM

## 2014-01-30 DIAGNOSIS — E039 Hypothyroidism, unspecified: Secondary | ICD-10-CM

## 2014-01-30 DIAGNOSIS — M79672 Pain in left foot: Secondary | ICD-10-CM

## 2014-01-30 DIAGNOSIS — IMO0001 Reserved for inherently not codable concepts without codable children: Secondary | ICD-10-CM

## 2014-01-30 DIAGNOSIS — IMO0002 Reserved for concepts with insufficient information to code with codable children: Secondary | ICD-10-CM

## 2014-01-30 DIAGNOSIS — I10 Essential (primary) hypertension: Secondary | ICD-10-CM

## 2014-01-30 DIAGNOSIS — M79609 Pain in unspecified limb: Secondary | ICD-10-CM

## 2014-01-30 LAB — BASIC METABOLIC PANEL
BUN: 12 mg/dL (ref 6–23)
CHLORIDE: 106 meq/L (ref 96–112)
CO2: 25 mEq/L (ref 19–32)
Calcium: 9.2 mg/dL (ref 8.4–10.5)
Creatinine, Ser: 0.7 mg/dL (ref 0.4–1.2)
GFR: 89.07 mL/min (ref >60.00)
Glucose, Bld: 132 mg/dL — ABNORMAL HIGH (ref 70–99)
Potassium: 4.4 mEq/L (ref 3.5–5.1)
SODIUM: 140 meq/L (ref 135–145)

## 2014-01-30 LAB — LIPID PANEL
CHOL/HDL RATIO: 5
Cholesterol: 205 mg/dL — ABNORMAL HIGH (ref 0–200)
HDL: 44.2 mg/dL (ref >39.00)
LDL Cholesterol: 123 mg/dL — ABNORMAL HIGH (ref 0–99)
TRIGLYCERIDES: 187 mg/dL — AB (ref 0.0–149.0)
VLDL: 37.4 mg/dL (ref 0.0–40.0)

## 2014-01-30 LAB — TSH: TSH: 3.89 u[IU]/mL (ref 0.35–5.50)

## 2014-01-30 LAB — HEMOGLOBIN A1C: Hgb A1c MFr Bld: 7.8 % — ABNORMAL HIGH (ref 4.6–6.5)

## 2014-01-30 MED ORDER — CANAGLIFLOZIN 300 MG PO TABS: 300.0000 mg | ORAL_TABLET | Freq: Every day | ORAL | Status: DC

## 2014-01-30 NOTE — Assessment & Plan Note (Signed)
Anticipate improvement based on cbg's endorsed today. Continue invokana - may increase to 300mg  if A1c >7%.

## 2014-01-30 NOTE — Assessment & Plan Note (Signed)
Treated as presumed MT stress fracture, slowly improving. Continue post op shoe for next 2 weeks then try transition to regular shoe, discussed importance of supportive shoes.

## 2014-01-30 NOTE — Progress Notes (Signed)
Pre visit review using our clinic review tool, if applicable. No additional management support is needed unless otherwise documented below in the visit note. 

## 2014-01-30 NOTE — Patient Instructions (Signed)
2 more weeks on post op shoe then transition to supportive regular shoe. Continue meds as up to now. I think invokana is working well.

## 2014-01-30 NOTE — Telephone Encounter (Signed)
Relevant patient education assigned to patient using Emmi. ° °

## 2014-01-30 NOTE — Assessment & Plan Note (Signed)
Chronic, stable. Continue med. 

## 2014-01-30 NOTE — Assessment & Plan Note (Signed)
Recheck FLP today. 

## 2014-01-30 NOTE — Progress Notes (Signed)
BP 130/84  Pulse 80  Temp(Src) 97.9 F (36.6 C) (Oral)  Wt 243 lb 12 oz (110.564 kg)   CC:  1 Mo f/u  Subjective:    Patient ID: Cheryl Brady, female    DOB: Jun 07, 1961, 53 y.o.   MRN: [id number]  HPI: Cheryl Brady is a 53 y.o. female presenting on 01/30/2014 with Follow-up   DM - Checking twice daily. Highest 168, averaging 120-130.  Compliant with invokana 100mg  daily and tolerating well except for mild early morning nausea.  Did not tolerate immediate or extended release metformin, did not tolerate [place], has not tolerated amaryl (glimeperide). Last eye appt was 07/2013, normal per patient. Denies paresthesias.  HTN - Compliant with current antihypertensive regimen of lisinopril 20mg  daily. Does check blood pressures at home: 100-120/60-80. No low blood pressure readings or symptoms of dizziness/syncope. Denies HA, vision changes, CP/tightness, SOB.   Foot pain - Ongoing pain for last 8 months. Last visit concern for MT stress fracture on left - but xray ok, placed in post op shoe to prevent bending at midfoot.  Has used shoe for the last month - and noticed improvement.  Occasional advil.  [place]anning trip [place] at end of [place].  Relevant past medical, surgical, family and social history reviewed and updated as indicated.  Allergies and medications reviewed and updated. Current Outpatient Prescriptions on File Prior to Visit  Medication Sig  . Canagliflozin 100 MG TABS Take 1 tablet (100 mg total) by mouth daily.  Marland Kitchen glucose blood (FREESTYLE LITE) test strip 1 each by Other route 2 (two) times daily. Freestyle Freedom Lite. 240.02  . levothyroxine (SYNTHROID, LEVOTHROID) 137 MCG tablet Take 1 tablet (137 mcg total) by mouth daily.  Marland Kitchen lisinopril (PRINIVIL,ZESTRIL) 20 MG tablet Take 1 tablet (20 mg total) by mouth daily.   No current facility-administered medications on file prior to visit.    Review of Systems Per HPI unless specifically indicated above    Objective:    BP  130/84  Pulse 80  Temp(Src) 97.9 F (36.6 C) (Oral)  Wt 243 lb 12 oz (110.564 kg)  Physical Exam  Nursing note and vitals reviewed. Constitutional: She appears well-developed and well-nourished. No distress.  HENT:  Head: Normocephalic and atraumatic.  Mouth/Throat: Oropharynx is clear and moist. No oropharyngeal exudate.  Eyes: Conjunctivae and EOM are normal. Pupils are equal, round, and reactive to light. No scleral icterus.  Cardiovascular: Normal rate, regular rhythm, normal heart sounds and intact distal pulses.   No murmur heard. Pulmonary/Chest: Effort normal and breath sounds normal. No respiratory distress. She has no wheezes. She has no rales.  Psychiatric: She has a normal mood and affect.       Assessment & Plan:   Problem List Items Addressed This Visit   Diabetes type 2, uncontrolled - Primary     Anticipate improvement based on cbg's endorsed today. Continue invokana - may increase to 300mg  if A1c >7%.    Relevant Orders      Hemoglobin A1c      Basic metabolic panel   Hyperlipidemia     Recheck FLP today.    Relevant Orders      Lipid panel   Hypertension     Chronic, stable. Continue med.    Hypothyroid   Relevant Orders      TSH   Left foot pain     Treated as presumed MT stress fracture, slowly improving. Continue post op shoe for next 2 weeks then try transition to  regular shoe, discussed importance of supportive shoes.        Follow up plan: Return in about 4 months (around 06/01/2014), or as needed, for follow up.

## 2014-01-31 ENCOUNTER — Encounter: Payer: Self-pay | Admitting: *Deleted

## 2014-02-01 ENCOUNTER — Telehealth: Payer: Self-pay

## 2014-02-01 NOTE — Telephone Encounter (Signed)
Relevant patient education assigned to patient using Emmi. ° °

## 2014-06-04 ENCOUNTER — Other Ambulatory Visit: Payer: Self-pay

## 2014-06-04 MED ORDER — LISINOPRIL 20 MG PO TABS: 20.0000 mg | ORAL_TABLET | Freq: Every day | ORAL | Status: DC

## 2014-06-04 NOTE — Telephone Encounter (Signed)
Cheryl Brady left v/m requesting refill lisinopril to [facility] mail order; pt already has appt scheduled 06/13/14.

## 2014-06-06 ENCOUNTER — Ambulatory Visit: Payer: PRIVATE HEALTH INSURANCE | Admitting: Family Medicine

## 2014-06-11 ENCOUNTER — Telehealth: Payer: Self-pay | Admitting: Family Medicine

## 2014-06-11 NOTE — Telephone Encounter (Signed)
Diabetic Bundle.  Pt needs lab appt for LDL check.  Pt declines appt at this time and will call back at a later date.

## 2014-06-13 ENCOUNTER — Ambulatory Visit: Payer: PRIVATE HEALTH INSURANCE | Admitting: Family Medicine

## 2014-08-06 ENCOUNTER — Other Ambulatory Visit: Payer: Self-pay | Admitting: *Deleted

## 2014-08-06 MED ORDER — LEVOTHYROXINE SODIUM 137 MCG PO TABS: 137.0000 ug | ORAL_TABLET | Freq: Every day | ORAL | Status: DC

## 2014-09-10 ENCOUNTER — Other Ambulatory Visit: Payer: Self-pay

## 2014-09-10 MED ORDER — LISINOPRIL 20 MG PO TABS: 20.0000 mg | ORAL_TABLET | Freq: Every day | ORAL | Status: DC

## 2014-09-10 NOTE — Addendum Note (Signed)
Addended by: Royann Shivers A on: 09/10/2014 10:13 AM   Modules accepted: Orders

## 2014-09-10 NOTE — Telephone Encounter (Signed)
Mr Cheryl Brady left v/m requesting refill lisinopril to [facility]. Left v/m advising refill done and pt needs to call for f/u appt.

## 2014-09-26 ENCOUNTER — Ambulatory Visit (INDEPENDENT_AMBULATORY_CARE_PROVIDER_SITE_OTHER): Payer: PRIVATE HEALTH INSURANCE | Admitting: Family Medicine

## 2014-09-26 ENCOUNTER — Encounter: Payer: Self-pay | Admitting: Family Medicine

## 2014-09-26 VITALS — BP 144/100 | HR 80 | Temp 97.9°F | Wt 247.2 lb

## 2014-09-26 DIAGNOSIS — IMO0002 Reserved for concepts with insufficient information to code with codable children: Secondary | ICD-10-CM

## 2014-09-26 DIAGNOSIS — E785 Hyperlipidemia, unspecified: Secondary | ICD-10-CM

## 2014-09-26 DIAGNOSIS — E1165 Type 2 diabetes mellitus with hyperglycemia: Secondary | ICD-10-CM

## 2014-09-26 DIAGNOSIS — M25552 Pain in left hip: Secondary | ICD-10-CM

## 2014-09-26 DIAGNOSIS — M797 Fibromyalgia: Secondary | ICD-10-CM

## 2014-09-26 DIAGNOSIS — Z0000 Encounter for general adult medical examination without abnormal findings: Secondary | ICD-10-CM

## 2014-09-26 DIAGNOSIS — E669 Obesity, unspecified: Secondary | ICD-10-CM

## 2014-09-26 DIAGNOSIS — I10 Essential (primary) hypertension: Secondary | ICD-10-CM

## 2014-09-26 DIAGNOSIS — E039 Hypothyroidism, unspecified: Secondary | ICD-10-CM

## 2014-09-26 DIAGNOSIS — M25551 Pain in right hip: Secondary | ICD-10-CM

## 2014-09-26 DIAGNOSIS — M79672 Pain in left foot: Secondary | ICD-10-CM

## 2014-09-26 LAB — LIPID PANEL
Cholesterol: 254 mg/dL — ABNORMAL HIGH (ref 0–200)
HDL: 52.3 mg/dL (ref >39.00)
NONHDL: 201.7
Total CHOL/HDL Ratio: 5
Triglycerides: 261 mg/dL — ABNORMAL HIGH (ref 0.0–149.0)
VLDL: 52.2 mg/dL — ABNORMAL HIGH (ref 0.0–40.0)

## 2014-09-26 LAB — MICROALBUMIN / CREATININE URINE RATIO
Creatinine,U: 140 mg/dL
Microalb Creat Ratio: 0.7 mg/g (ref 0.0–30.0)
Microalb, Ur: 1 mg/dL (ref 0.0–1.9)

## 2014-09-26 LAB — COMPREHENSIVE METABOLIC PANEL
ALBUMIN: 3.8 g/dL (ref 3.5–5.2)
ALT: 79 U/L — AB (ref 0–35)
AST: 58 U/L — ABNORMAL HIGH (ref 0–37)
Alkaline Phosphatase: 135 U/L — ABNORMAL HIGH (ref 39–117)
BUN: 13 mg/dL (ref 6–23)
CALCIUM: 9.7 mg/dL (ref 8.4–10.5)
CHLORIDE: 99 meq/L (ref 96–112)
CO2: 30 mEq/L (ref 19–32)
Creatinine, Ser: 0.7 mg/dL (ref 0.4–1.2)
GFR: 87.46 mL/min (ref >60.00)
Glucose, Bld: 271 mg/dL — ABNORMAL HIGH (ref 70–99)
Potassium: 5 mEq/L (ref 3.5–5.1)
Sodium: 134 mEq/L — ABNORMAL LOW (ref 135–145)
TOTAL PROTEIN: 7.8 g/dL (ref 6.0–8.3)
Total Bilirubin: 0.8 mg/dL (ref 0.2–1.2)

## 2014-09-26 LAB — LDL CHOLESTEROL, DIRECT: Direct LDL: 156.1 mg/dL

## 2014-09-26 LAB — HEMOGLOBIN A1C: Hgb A1c MFr Bld: 10.2 % — ABNORMAL HIGH (ref 4.6–6.5)

## 2014-09-26 MED ORDER — CANAGLIFLOZIN 100 MG PO TABS: 100.0000 mg | ORAL_TABLET | Freq: Every day | ORAL | Status: DC

## 2014-09-26 MED ORDER — LISINOPRIL 20 MG PO TABS: 20.0000 mg | ORAL_TABLET | Freq: Every day | ORAL | Status: DC

## 2014-09-26 MED ORDER — LEVOTHYROXINE SODIUM 137 MCG PO TABS: 137.0000 ug | ORAL_TABLET | Freq: Every day | ORAL | Status: DC

## 2014-09-26 NOTE — Assessment & Plan Note (Signed)
Recheck TSH today.  

## 2014-09-26 NOTE — Assessment & Plan Note (Signed)
Exam consistent with bilateral greater trochanteric bursitis. Provided with SM pt advisor exercises. If persistent pain, suggested eval by SM Dr. Lorelei Pont.

## 2014-09-26 NOTE — Assessment & Plan Note (Signed)
Recheck FLP today. Discussed of above goal LDL <100 will recommend statin.

## 2014-09-26 NOTE — Assessment & Plan Note (Addendum)
Deteriorated. Did not tolerate invokana 300mg  but previously did better on 100mg  dose.  Has not tolerated januvia, metformin, amaryl in past. Pt adamant about avoiding needles. Will restart invokana 100mg  dose and f/u in 3 months. Also discussed importance of adherence to diabetic diet, restarting exercise routine and weight loss. Pt will call to reschedule diabetes education - referred 05/2013. Pt agrees with plan.

## 2014-09-26 NOTE — Progress Notes (Signed)
Pre visit review using our clinic review tool, if applicable. No additional management support is needed unless otherwise documented below in the visit note. 

## 2014-09-26 NOTE — Assessment & Plan Note (Signed)
Persistent pain previously thought MT stress fracture despite normal Xrays, improves while on boot but then recurs. Wants definitive diagnosis so will order MRI L foot to eval for stress fracture.  rec restart using postop shoe to L foot when on her feet.

## 2014-09-26 NOTE — Progress Notes (Signed)
BP 144/100  Pulse 80  Temp(Src) 97.9 F (36.6 C) (Oral)  Wt 247 lb 4 oz (112.152 kg)   CC: 7 mo f/u visit, med refill  Subjective:    Patient ID: Cheryl Brady, female    DOB: December 05, 1960, 53 y.o.   MRN: [id number]  HPI: Cheryl Brady is a 53 y.o. female presenting on 09/26/2014 for Medication Refill   DM - Checking sugars once a week. Average cbg 230. Not compliant with diabetic diet. Did not tolerate immediate or extended release metformin, did not tolerate [place], has not tolerated amaryl (glimeperide). Off invokana (prescribed 01/2014) - caused stomach upset and dizziness. Last eye appt was 07/2013, normal per patient. Due for this. + L paresthesias.  Lab Results  Component Value Date   HGBA1C 7.8* 01/30/2014   Wt Readings from Last 3 Encounters:  09/26/14 247 lb 4 oz (112.152 kg)  01/30/14 243 lb 12 oz (110.564 kg)  01/01/14 245 lb 12 oz (111.471 kg)   Body mass index is 39.31 kg/(m^2).  HTN - bp elevated today - pt endorses compliance with current antihypertensive regimen of lisinopril 20mg  daily. Does check blood pressures at home: 120-130/80. Did take lisinopril 20mg  nightly. No low blood pressure readings or symptoms of dizziness/syncope. Denies vision changes, CP/tightness, SOB.  Currently with headache.   R sided leg pain - ongoing for last 4 days.  Now easing up. Bilateral hip pain worse on right.  Worsening FM pain over last few months. "hurting all over" with increased fatigue. Increase stress recently. Cymbalta worsened anxiety 2 yrs ago. Using heating pad, OTC ibuprofen, bc powders, advil migraine.  Persistent pain L midfoot - previously thought stress fracture that then improved but now pain persists.  LEFT FOOT - COMPLETE 3+ VIEW  COMPARISON: None.  FINDINGS:  No fracture. The joints are normally space and aligned.  There is mild spurring from the distal tibia at the tibiotalar  articular surface. Small plantar and dorsal calcaneal spurs are  seen.  There  is evidence of mild dorsal forefoot soft tissue swelling.  IMPRESSION:  1. No fracture. No joint abnormality in the foot. There are mild  arthropathic changes at the tibiotalar joint.  2. Small plantar and dorsal calcaneal spurs.  Electronically Signed  By: Lajean Manes M.D.  On: 01/01/2014 11:16  Relevant past medical, surgical, family and social history reviewed and updated as indicated.  Allergies and medications reviewed and updated. Current Outpatient Prescriptions on File Prior to Visit  Medication Sig  . glucose blood (FREESTYLE LITE) test strip 1 each by Other route 2 (two) times daily. Freestyle Freedom Lite. 240.02   No current facility-administered medications on file prior to visit.   Past Medical History  Diagnosis Date  . Sickle cell anemia 2007    ??dx at hysterectomy (per pt)  likely just anemia, never received records, no current anemia.  . Diabetes type 2, uncontrolled ~2006    DSME [facility] 03/2013  . Perennial allergic rhinitis   . Hypertension   . Migraines     silent migraines - dizzy and nauseated, photophobia  . Hypothyroid   . Fibromyalgia   . Hyperlipidemia    Review of Systems Per HPI unless specifically indicated above    Objective:    BP 144/100  Pulse 80  Temp(Src) 97.9 F (36.6 C) (Oral)  Wt 247 lb 4 oz (112.152 kg)  Physical Exam  Nursing note and vitals reviewed. Constitutional: She appears well-developed and well-nourished. No distress.  HENT:  Head: Normocephalic and atraumatic.  Right Ear: External ear normal.  Left Ear: External ear normal.  Nose: Nose normal.  Mouth/Throat: Oropharynx is clear and moist. No oropharyngeal exudate.  Eyes: Conjunctivae and EOM are normal. Pupils are equal, round, and reactive to light. No scleral icterus.  Neck: Normal range of motion. Neck supple.  Cardiovascular: Normal rate, regular rhythm, normal heart sounds and intact distal pulses.   No murmur heard. Pulmonary/Chest: Effort normal and breath  sounds normal. No respiratory distress. She has no wheezes. She has no rales.  Musculoskeletal: She exhibits no edema.  Diabetic foot exam: Normal inspection No skin breakdown No calluses  Normal DP/PT pulses Normal sensation to light tough and monofilament Nails normal Tender to palpation bilateral GTB No calf or thigh pain on right to palpation  Lymphadenopathy:    She has no cervical adenopathy.  Skin: Skin is warm and dry. No rash noted.  Psychiatric: She has a normal mood and affect.   Results for orders placed in visit on 01/30/14  HEMOGLOBIN A1C      Result Value Ref Range   Hemoglobin A1C 7.8 (*) 4.6 - 6.5 %  LIPID PANEL      Result Value Ref Range   Cholesterol 205 (*) 0 - 200 mg/dL   Triglycerides 187.0 (*) 0.0 - 149.0 mg/dL   HDL 44.20  >39.00 mg/dL   VLDL 37.4  0.0 - 40.0 mg/dL   LDL Cholesterol 123 (*) 0 - 99 mg/dL   Total CHOL/HDL Ratio 5    BASIC METABOLIC PANEL      Result Value Ref Range   Sodium 140  135 - 145 mEq/L   Potassium 4.4  3.5 - 5.1 mEq/L   Chloride 106  96 - 112 mEq/L   CO2 25  19 - 32 mEq/L   Glucose, Bld 132 (*) 70 - 99 mg/dL   BUN 12  6 - 23 mg/dL   Creatinine, Ser 0.7  0.4 - 1.2 mg/dL   Calcium 9.2  8.4 - 10.5 mg/dL   GFR 89.07  >60.00 mL/min  TSH      Result Value Ref Range   TSH 3.89  0.35 - 5.50 uIU/mL      Assessment & Plan:   Problem List Items Addressed This Visit   Left foot pain     Persistent pain previously thought MT stress fracture despite normal Xrays, improves while on boot but then recurs. Wants definitive diagnosis so will order MRI L foot to eval for stress fracture.  rec restart using postop shoe to L foot when on her feet.    Relevant Orders      MR Foot Left Wo Contrast   Hypothyroid     Recheck TSH today.    Relevant Medications      levothyroxine (SYNTHROID, LEVOTHROID) tablet   Other Relevant Orders      TSH   Hypertension     Elevated today but pt endorses good control at home. No changes made to  meds today.    Relevant Medications      lisinopril (PRINIVIL,ZESTRIL) tablet   Hyperlipidemia     Recheck FLP today. Discussed of above goal LDL <100 will recommend statin.    Relevant Medications      lisinopril (PRINIVIL,ZESTRIL) tablet   Other Relevant Orders      Lipid panel      Comprehensive metabolic panel   Hip pain, bilateral     Exam consistent with bilateral greater trochanteric bursitis. Provided  with SM pt advisor exercises. If persistent pain, suggested eval by SM Dr. Lorelei Pont.    Fibromyalgia     Encouraged slowly increase exercise/aerobic activity for improvement of FM pain. Intolerant to cymbalta in past. Consider lyrica if no better.    Diabetes type 2, uncontrolled - Primary     Deteriorated. Did not tolerate invokana 300mg  but previously did better on 100mg  dose.  Has not tolerated januvia, metformin, amaryl in past. Pt adamant about avoiding needles. Will restart invokana 100mg  dose and f/u in 3 months. Also discussed importance of adherence to diabetic diet, restarting exercise routine and weight loss. Pt will call to reschedule diabetes education - referred 05/2013. Pt agrees with plan.    Relevant Medications      lisinopril (PRINIVIL,ZESTRIL) tablet      canagliflozin (INVOKANA) 100 MG TABS tablet   Other Relevant Orders      Hemoglobin A1c      Microalbumin / creatinine urine ratio      HM DIABETES FOOT EXAM (Completed)      [phone] - [facility] [place] - not in [place] system Follow up plan: Return in about 3 months (around 12/27/2014), or as needed, for follow up visit.

## 2014-09-26 NOTE — Patient Instructions (Addendum)
Schedule eye exam as you're due. Blood work today. Restart invokana at lower 100mg  dose. Return in 3 months for follow up diabetes. Call [facility] diabetes education to reschedule classes [phone] We will call you to schedule further imaging for left foot. Come in sooner if needed.  Exercises provided today for hip bursitis R>L

## 2014-09-26 NOTE — Assessment & Plan Note (Signed)
Encouraged slowly increase exercise/aerobic activity for improvement of FM pain. Intolerant to cymbalta in past. Consider lyrica if no better.

## 2014-09-26 NOTE — Assessment & Plan Note (Signed)
Elevated today but pt endorses good control at home. No changes made to meds today.

## 2014-09-27 LAB — TSH: TSH: 3.99 u[IU]/mL (ref 0.35–4.50)

## 2014-09-30 ENCOUNTER — Telehealth: Payer: Self-pay | Admitting: Family Medicine

## 2014-09-30 NOTE — Telephone Encounter (Signed)
Patient's husband called.  When patient came in for her appointment last Thursday, she was told to wear her boot. Patient can't find her boot and they want to know if a boot can be ordered.  Patient would like to pick up the boot tomorrow.

## 2014-10-02 ENCOUNTER — Other Ambulatory Visit: Payer: Self-pay | Admitting: Family Medicine

## 2014-10-02 MED ORDER — ATORVASTATIN CALCIUM 40 MG PO TABS: 40.0000 mg | ORAL_TABLET | Freq: Every day | ORAL | Status: DC

## 2014-10-02 NOTE — Telephone Encounter (Signed)
Late entry- patient's husband called the front desk yesterday in regards to this because there had been no response. I advised the front desk she could have another boot, but would be charged and would need to come in to sign paperwork. She came in when I was at lunch. Office staff called me and I said I would return immediately if patient didn't mind waiting so that I could fit her foot, which was fine with the patient. I returned in less than 15 minutes and the front office advised me that the patient left because she didn't feel well. ~2 hours later only her husband returned to pick up the boot. I advised that I couldn't give him the boot without the patient because I needed to make sure she was properly fitted. He said "well she wears a size 9 shoe". I advised that I still needed her here to make sure the boot fit properly especially with her being a diabetic because improper fit could cause a foot wound. He was not happy about having to make a third trip to the office and said the front office told him that he only needed to sign paperwork when he returned the second time. I apologized for the inconvenience and for the error.

## 2014-10-03 ENCOUNTER — Other Ambulatory Visit: Payer: Self-pay

## 2014-10-03 ENCOUNTER — Ambulatory Visit: Payer: Self-pay | Admitting: Family Medicine

## 2014-10-03 MED ORDER — ATORVASTATIN CALCIUM 40 MG PO TABS: 40.0000 mg | ORAL_TABLET | Freq: Every day | ORAL | Status: DC

## 2014-10-03 NOTE — Telephone Encounter (Signed)
Patient requested Rx be sent to [facility] by mail [facility]. [facility] order cancelled.

## 2014-10-04 ENCOUNTER — Encounter: Payer: Self-pay | Admitting: Family Medicine

## 2014-10-06 ENCOUNTER — Encounter: Payer: Self-pay | Admitting: Family Medicine

## 2014-10-13 ENCOUNTER — Encounter: Payer: Self-pay | Admitting: Family Medicine

## 2014-10-25 ENCOUNTER — Encounter: Payer: Self-pay | Admitting: Family Medicine

## 2014-10-25 ENCOUNTER — Ambulatory Visit (INDEPENDENT_AMBULATORY_CARE_PROVIDER_SITE_OTHER): Payer: PRIVATE HEALTH INSURANCE | Admitting: Family Medicine

## 2014-10-25 VITALS — BP 130/80 | HR 64 | Temp 98.2°F | Wt 243.0 lb

## 2014-10-25 DIAGNOSIS — H00013 Hordeolum externum right eye, unspecified eyelid: Secondary | ICD-10-CM

## 2014-10-25 DIAGNOSIS — D649 Anemia, unspecified: Secondary | ICD-10-CM

## 2014-10-25 DIAGNOSIS — H00019 Hordeolum externum unspecified eye, unspecified eyelid: Secondary | ICD-10-CM | POA: Insufficient documentation

## 2014-10-25 MED ORDER — ERYTHROMYCIN 5 MG/GM OP OINT: 1.0000 "application " | TOPICAL_OINTMENT | Freq: Three times a day (TID) | OPHTHALMIC | Status: DC

## 2014-10-25 MED ORDER — GLUCOSE BLOOD VI STRP: 1.0000 | ORAL_STRIP | Freq: Two times a day (BID) | Status: DC

## 2014-10-25 NOTE — Progress Notes (Signed)
Pre visit review using our clinic review tool, if applicable. No additional management support is needed unless otherwise documented below in the visit note. 

## 2014-10-25 NOTE — Progress Notes (Signed)
   BP 130/80 mmHg  Pulse 64  Temp(Src) 98.2 F (36.8 C) (Oral)  Wt 243 lb (110.224 kg)   CC: eye swollen  Subjective:    Patient ID: Cheryl Brady, female    DOB: 24-Apr-1961, 53 y.o.   MRN: [id number]  HPI: Cheryl Brady is a 53 y.o. female presenting on 10/25/2014 for Eye Problem   R eye swelling for last 2 days, then yesterday noticed stye on lower eyelid. So far has tried warm compresses and salt water wash.  Some drainage noted as well as eye redness. No uri sxs.  No fevers/chills, vision changes, pain with eye movement.  Has had styes in the past treated with abx ointment.   DM - tolerating invokana 100mg  daily. Checks sugars twice daily. Fasting 124. Started protein smoothie "super green" without sugar.  Lab Results  Component Value Date   HGBA1C 10.2* 09/26/2014   Lab Results  Component Value Date   TSH 3.99 09/26/2014   Relevant past medical, surgical, family and social history reviewed and updated as indicated. Interim medical history since our last visit reviewed. Allergies and medications reviewed and updated.  Current Outpatient Prescriptions on File Prior to Visit  Medication Sig  . atorvastatin (LIPITOR) 40 MG tablet Take 1 tablet (40 mg total) by mouth daily.  . canagliflozin (INVOKANA) 100 MG TABS tablet Take 1 tablet (100 mg total) by mouth daily.  Marland Kitchen levothyroxine (SYNTHROID, LEVOTHROID) 137 MCG tablet Take 1 tablet (137 mcg total) by mouth daily.  Marland Kitchen lisinopril (PRINIVIL,ZESTRIL) 20 MG tablet Take 1 tablet (20 mg total) by mouth daily.   No current facility-administered medications on file prior to visit.    Review of Systems Per HPI unless specifically indicated above     Objective:    BP 130/80 mmHg  Pulse 64  Temp(Src) 98.2 F (36.8 C) (Oral)  Wt 243 lb (110.224 kg)  Wt Readings from Last 3 Encounters:  10/25/14 243 lb (110.224 kg)  09/26/14 247 lb 4 oz (112.152 kg)  01/30/14 243 lb 12 oz (110.564 kg)    Physical Exam    Constitutional: She appears well-developed and well-nourished. No distress.  Eyes: EOM are normal. Pupils are equal, round, and reactive to light. Right eye exhibits hordeolum. Left eye exhibits no hordeolum. Right conjunctiva is injected. Left conjunctiva is not injected. No scleral icterus.    Mild injection of bulbar conjunctiva on right EOMI without pain Lump present at lower R eyelid with cyst seen on inner eyelid and some drainage present  Nursing note and vitals reviewed.      Assessment & Plan:   Problem List Items Addressed This Visit    Stye    Treat with abx ointment and warm compresses. Update if persistent for ophtho referral. Pt/husband agree with plan.    RESOLVED: Anemia - Primary       Follow up plan: Return if symptoms worsen or fail to improve.

## 2014-10-25 NOTE — Assessment & Plan Note (Signed)
Treat with abx ointment and warm compresses. Update if persistent for ophtho referral. Pt/husband agree with plan.

## 2014-10-25 NOTE — Patient Instructions (Signed)
Let's start using antibiotic ointment for stye. Continue compression stockings. Let us know if persistent stye for referral to eye doctor. Return at previously scheduled appointment  Sty A sty (hordeolum) is an infection of a gland in the eyelid located at the base of the eyelash. A sty may develop a white or yellow head of pus. It can be puffy (swollen). Usually, the sty will burst and pus will come out on its own. They do not leave lumps in the eyelid once they drain. A sty is often confused with another form of cyst of the eyelid called a chalazion. Chalazions occur within the eyelid and not on the edge where the bases of the eyelashes are. They often are red, sore and then form firm lumps in the eyelid. CAUSES   Germs (bacteria).  Lasting (chronic) eyelid inflammation. SYMPTOMS   Tenderness, redness and swelling along the edge of the eyelid at the base of the eyelashes.  Sometimes, there is a white or yellow head of pus. It may or may not drain. DIAGNOSIS  An ophthalmologist will be able to distinguish between a sty and a chalazion and treat the condition appropriately.  TREATMENT   Styes are typically treated with warm packs (compresses) until drainage occurs.  In rare cases, medicines that kill germs (antibiotics) may be prescribed. These antibiotics may be in the form of drops, cream or pills.  If a hard lump has formed, it is generally necessary to do a small incision and remove the hardened contents of the cyst in a minor surgical procedure done in the office.  In suspicious cases, your caregiver may send the contents of the cyst to the lab to be certain that it is not a rare, but dangerous form of cancer of the glands of the eyelid. HOME CARE INSTRUCTIONS   Wash your hands often and dry them with a clean towel. Avoid touching your eyelid. This may spread the infection to other parts of the eye.  Apply heat to your eyelid for 10 to 20 minutes, several times a day, to ease  pain and help to heal it faster.  Do not squeeze the sty. Allow it to drain on its own. Wash your eyelid carefully 3 to 4 times per day to remove any pus. SEEK IMMEDIATE MEDICAL CARE IF:   Your eye becomes painful or puffy (swollen).  Your vision changes.  Your sty does not drain by itself within 3 days.  Your sty comes back within a short period of time, even with treatment.  You have redness (inflammation) around the eye.  You have a fever. Document Released: 08/25/2005 Document Revised: 02/07/2012 Document Reviewed: 03/01/2014 [facility] Patient Information 2015 [place], [place]. This information is not intended to replace advice given to you by your health care provider. Make sure you discuss any questions you have with your health care provider.

## 2014-10-28 ENCOUNTER — Telehealth: Payer: Self-pay

## 2014-10-28 NOTE — Telephone Encounter (Signed)
To: [facility] (After Hours Triage) Fax: [phone] From: [facility] Date/ Time: 10/25/2014 9:42 PM Taken By: Adriana Reams, CSR Caller: [place]: not collected Patient: Cheryl Brady, Cheryl Brady DOB: 1961/09/02 Phone: [phone] Reason for Call: Medication Issue. Order for Erythromycin opthalmic ointment was called to [facility] instead of pharmacy. I called Rx in to [facility] on [address] obtained from [employer]. Regarding Appointment: Appt Date: Appt Time: Unknown Provider: Reason: Details: Confidential Outcome:

## 2014-12-26 ENCOUNTER — Encounter: Payer: Self-pay | Admitting: Family Medicine

## 2014-12-26 ENCOUNTER — Ambulatory Visit (INDEPENDENT_AMBULATORY_CARE_PROVIDER_SITE_OTHER): Payer: PRIVATE HEALTH INSURANCE | Admitting: Family Medicine

## 2014-12-26 VITALS — BP 110/70 | HR 68 | Temp 98.1°F | Wt 227.5 lb

## 2014-12-26 DIAGNOSIS — E1165 Type 2 diabetes mellitus with hyperglycemia: Secondary | ICD-10-CM

## 2014-12-26 DIAGNOSIS — I10 Essential (primary) hypertension: Secondary | ICD-10-CM

## 2014-12-26 DIAGNOSIS — IMO0002 Reserved for concepts with insufficient information to code with codable children: Secondary | ICD-10-CM

## 2014-12-26 DIAGNOSIS — E785 Hyperlipidemia, unspecified: Secondary | ICD-10-CM

## 2014-12-26 DIAGNOSIS — E039 Hypothyroidism, unspecified: Secondary | ICD-10-CM

## 2014-12-26 DIAGNOSIS — E669 Obesity, unspecified: Secondary | ICD-10-CM

## 2014-12-26 LAB — COMPREHENSIVE METABOLIC PANEL
ALK PHOS: 106 U/L (ref 39–117)
ALT: 23 U/L (ref 0–35)
AST: 20 U/L (ref 0–37)
Albumin: 4.3 g/dL (ref 3.5–5.2)
BUN: 18 mg/dL (ref 6–23)
CO2: 24 meq/L (ref 19–32)
Calcium: 9.9 mg/dL (ref 8.4–10.5)
Chloride: 107 mEq/L (ref 96–112)
Creatinine, Ser: 0.69 mg/dL (ref 0.40–1.20)
GFR: 94.72 mL/min (ref >60.00)
Glucose, Bld: 148 mg/dL — ABNORMAL HIGH (ref 70–99)
POTASSIUM: 4.5 meq/L (ref 3.5–5.1)
Sodium: 142 mEq/L (ref 135–145)
TOTAL PROTEIN: 7.4 g/dL (ref 6.0–8.3)
Total Bilirubin: 0.4 mg/dL (ref 0.2–1.2)

## 2014-12-26 LAB — HEMOGLOBIN A1C: Hgb A1c MFr Bld: 7.1 % — ABNORMAL HIGH (ref 4.6–6.5)

## 2014-12-26 LAB — LDL CHOLESTEROL, DIRECT: Direct LDL: 124 mg/dL

## 2014-12-26 MED ORDER — LISINOPRIL 10 MG PO TABS: 10.0000 mg | ORAL_TABLET | Freq: Every day | ORAL | Status: DC

## 2014-12-26 NOTE — Patient Instructions (Addendum)
Schedule eye doctor appointment. Cut lisinopril in half - 10mg  daily. New dose at pharmacy. Blood work today. Return in 4 months for physical

## 2014-12-26 NOTE — Progress Notes (Signed)
Pre visit review using our clinic review tool, if applicable. No additional management support is needed unless otherwise documented below in the visit note. 

## 2014-12-26 NOTE — Assessment & Plan Note (Signed)
Congratulated on wonderful changes to date! 20lb weight loss in last 3 months Anticipate significant improvement in A1c - recheck today. RTC 4 mo f/u Continue invokana 100mg  daily labwork today, foot exam today. rec schedule eye exam. Pt declines pneumovax.

## 2014-12-26 NOTE — Assessment & Plan Note (Signed)
Chronic, improved. Pt states great control at home (<374 systolic). Will decrease lisinopril to 10mg  daily.

## 2014-12-26 NOTE — Assessment & Plan Note (Signed)
Check TSH next visit.

## 2014-12-26 NOTE — Assessment & Plan Note (Signed)
Congratulated on weight loss to date - 20lb weight loss in last 3 months! Pt motivated to continue healthy diet and lifestyle changes implemented up to now. Currently on atkins' diet. Feels changes are sustainable.

## 2014-12-26 NOTE — Assessment & Plan Note (Signed)
Not fasting today - check dLDL today. Compliant with lipitor.

## 2014-12-26 NOTE — Progress Notes (Signed)
BP 110/70 mmHg  Pulse 68  Temp(Src) 98.1 F (36.7 C) (Oral)  Wt 227 lb 8 oz (103.193 kg)   CC: 3 mo f/u visit  Subjective:    Patient ID: Cheryl Brady, female    DOB: November 24, 1961, 54 y.o.   MRN: [id number]  HPI: Cheryl Brady is a 54 y.o. female presenting on 12/26/2014 for Follow-up   Obesity - down 20 lbs! Using Lose It fitness app. Walking on treadmill 20-30 min every other day, using resistance bands on alternate days. Atkins diet - Atkins shake for breakfast, atkins microwaveable meals for lunch, supper is steamed vegetables. Using sugar free jellos.   DM - regularly does check sugars fasting 110. Compliant with antihyperglycemic regimen which includes: invokana (canagliflozin) 100mg  daily. Intolerant to multiple other antidiabetic meds including januvia, amaryl, and metformin.  Denies low sugars or hypoglycemic symptoms.  Denies paresthesias. Last diabetic eye exam DUE.  Pneumovax: declines.  Prevnar: not due. Declines flu shot.  Lab Results  Component Value Date   HGBA1C 10.2* 09/26/2014  due for labwork today. Diabetic Foot Exam - Simple   Simple Foot Form  Diabetic Foot exam was performed with the following findings:  Yes 12/26/2014 10:08 AM  Visual Inspection  No deformities, no ulcerations, no other skin breakdown bilaterally:  Yes  Sensation Testing  Intact to touch and monofilament testing bilaterally:  Yes  Pulse Check  Posterior Tibialis and Dorsalis pulse intact bilaterally:  Yes  Comments     HTN - Compliant with current antihypertensive regimen of lisinopril 20mg  daily. Does check blood pressures at home: great control at home. No low blood pressure readings or symptoms of dizziness/syncope. Denies HA, vision changes, CP/tightness, SOB, leg swelling.   HLD - compliant with lipitor 40mg  daily without myalgias  Hypothyroidism - compliant with levothyroxine 140mcg daily. Lab Results  Component Value Date   TSH 3.99 09/26/2014    Relevant past medical,  surgical, family and social history reviewed and updated as indicated. Interim medical history since our last visit reviewed. Allergies and medications reviewed and updated. Current Outpatient Prescriptions on File Prior to Visit  Medication Sig  . atorvastatin (LIPITOR) 40 MG tablet Take 1 tablet (40 mg total) by mouth daily.  . canagliflozin (INVOKANA) 100 MG TABS tablet Take 1 tablet (100 mg total) by mouth daily.  Marland Kitchen glucose blood (FREESTYLE LITE) test strip 1 each by Other route 2 (two) times daily. Freestyle Freedom Lite. 240.02  . levothyroxine (SYNTHROID, LEVOTHROID) 137 MCG tablet Take 1 tablet (137 mcg total) by mouth daily.   No current facility-administered medications on file prior to visit.    Review of Systems Per HPI unless specifically indicated above     Objective:    BP 110/70 mmHg  Pulse 68  Temp(Src) 98.1 F (36.7 C) (Oral)  Wt 227 lb 8 oz (103.193 kg)  Wt Readings from Last 3 Encounters:  12/26/14 227 lb 8 oz (103.193 kg)  10/25/14 243 lb (110.224 kg)  09/26/14 247 lb 4 oz (112.152 kg)    Physical Exam  Constitutional: She appears well-developed and well-nourished. No distress.  HENT:  Head: Normocephalic and atraumatic.  Right Ear: External ear normal.  Left Ear: External ear normal.  Nose: Nose normal.  Mouth/Throat: Oropharynx is clear and moist. No oropharyngeal exudate.  Eyes: Conjunctivae and EOM are normal. Pupils are equal, round, and reactive to light. No scleral icterus.  Neck: Normal range of motion. Neck supple.  Cardiovascular: Normal rate, regular rhythm, normal heart  sounds and intact distal pulses.   No murmur heard. Pulmonary/Chest: Effort normal and breath sounds normal. No respiratory distress. She has no wheezes. She has no rales.  Musculoskeletal: She exhibits no edema.  See HPI for foot exam if done  Lymphadenopathy:    She has no cervical adenopathy.  Skin: Skin is warm and dry. No rash noted.  Psychiatric: She has a normal  mood and affect.  Nursing note and vitals reviewed.      Assessment & Plan:   Problem List Items Addressed This Visit    Obesity    Congratulated on weight loss to date - 20lb weight loss in last 3 months! Pt motivated to continue healthy diet and lifestyle changes implemented up to now. Currently on atkins' diet. Feels changes are sustainable.      Hypothyroid    Check TSH next visit.      Hypertension    Chronic, improved. Pt states great control at home (<607 systolic). Will decrease lisinopril to 10mg  daily.      Relevant Medications   lisinopril (PRINIVIL,ZESTRIL) tablet   Hyperlipidemia    Not fasting today - check dLDL today. Compliant with lipitor.      Relevant Medications   lisinopril (PRINIVIL,ZESTRIL) tablet   Other Relevant Orders   Comprehensive metabolic panel   LDL Cholesterol, Direct   Diabetes type 2, uncontrolled - Primary    Congratulated on wonderful changes to date! 20lb weight loss in last 3 months Anticipate significant improvement in A1c - recheck today. RTC 4 mo f/u Continue invokana 100mg  daily labwork today, foot exam today. rec schedule eye exam. Pt declines pneumovax.      Relevant Medications   lisinopril (PRINIVIL,ZESTRIL) tablet   Other Relevant Orders   Hemoglobin A1c       Follow up plan: Return in about 4 months (around 04/26/2015), or as needed, for annual exam, prior fasting for blood work.

## 2014-12-27 ENCOUNTER — Encounter: Payer: Self-pay | Admitting: *Deleted

## 2015-04-24 ENCOUNTER — Other Ambulatory Visit (INDEPENDENT_AMBULATORY_CARE_PROVIDER_SITE_OTHER): Payer: PRIVATE HEALTH INSURANCE

## 2015-04-24 ENCOUNTER — Other Ambulatory Visit: Payer: Self-pay | Admitting: Family Medicine

## 2015-04-24 DIAGNOSIS — E785 Hyperlipidemia, unspecified: Secondary | ICD-10-CM | POA: Diagnosis not present

## 2015-04-24 DIAGNOSIS — IMO0002 Reserved for concepts with insufficient information to code with codable children: Secondary | ICD-10-CM

## 2015-04-24 DIAGNOSIS — E1165 Type 2 diabetes mellitus with hyperglycemia: Secondary | ICD-10-CM

## 2015-04-24 DIAGNOSIS — E039 Hypothyroidism, unspecified: Secondary | ICD-10-CM | POA: Diagnosis not present

## 2015-04-24 LAB — LIPID PANEL
CHOL/HDL RATIO: 5
Cholesterol: 185 mg/dL (ref 0–200)
HDL: 39.6 mg/dL (ref >39.00)
LDL CALC: 109 mg/dL — AB (ref 0–99)
NONHDL: 145.4
Triglycerides: 180 mg/dL — ABNORMAL HIGH (ref 0.0–149.0)
VLDL: 36 mg/dL (ref 0.0–40.0)

## 2015-04-24 LAB — HEMOGLOBIN A1C: HEMOGLOBIN A1C: 5.3 % (ref 4.6–6.5)

## 2015-04-24 LAB — TSH: TSH: 3.11 u[IU]/mL (ref 0.35–4.50)

## 2015-05-01 ENCOUNTER — Ambulatory Visit (INDEPENDENT_AMBULATORY_CARE_PROVIDER_SITE_OTHER): Payer: PRIVATE HEALTH INSURANCE | Admitting: Family Medicine

## 2015-05-01 ENCOUNTER — Telehealth: Payer: Self-pay

## 2015-05-01 ENCOUNTER — Encounter: Payer: Self-pay | Admitting: Family Medicine

## 2015-05-01 VITALS — BP 124/82 | HR 68 | Temp 98.1°F | Ht 66.5 in | Wt 194.5 lb

## 2015-05-01 DIAGNOSIS — M7712 Lateral epicondylitis, left elbow: Secondary | ICD-10-CM

## 2015-05-01 DIAGNOSIS — Z1211 Encounter for screening for malignant neoplasm of colon: Secondary | ICD-10-CM

## 2015-05-01 DIAGNOSIS — E669 Obesity, unspecified: Secondary | ICD-10-CM

## 2015-05-01 DIAGNOSIS — IMO0002 Reserved for concepts with insufficient information to code with codable children: Secondary | ICD-10-CM

## 2015-05-01 DIAGNOSIS — Z0000 Encounter for general adult medical examination without abnormal findings: Secondary | ICD-10-CM

## 2015-05-01 DIAGNOSIS — Z1239 Encounter for other screening for malignant neoplasm of breast: Secondary | ICD-10-CM

## 2015-05-01 DIAGNOSIS — R21 Rash and other nonspecific skin eruption: Secondary | ICD-10-CM | POA: Insufficient documentation

## 2015-05-01 DIAGNOSIS — E1165 Type 2 diabetes mellitus with hyperglycemia: Secondary | ICD-10-CM

## 2015-05-01 DIAGNOSIS — E66811 Obesity, class 1: Secondary | ICD-10-CM

## 2015-05-01 DIAGNOSIS — E039 Hypothyroidism, unspecified: Secondary | ICD-10-CM

## 2015-05-01 DIAGNOSIS — I10 Essential (primary) hypertension: Secondary | ICD-10-CM

## 2015-05-01 DIAGNOSIS — E785 Hyperlipidemia, unspecified: Secondary | ICD-10-CM

## 2015-05-01 DIAGNOSIS — Z01419 Encounter for gynecological examination (general) (routine) without abnormal findings: Secondary | ICD-10-CM

## 2015-05-01 MED ORDER — NAPROXEN 500 MG PO TABS: ORAL_TABLET | ORAL | Status: DC

## 2015-05-01 NOTE — Assessment & Plan Note (Signed)
With weight loss and healthy diet changes, another 33lbs lost. No longer diabetic. Will remove from problem list.

## 2015-05-01 NOTE — Assessment & Plan Note (Signed)
Marked improvement! Body mass index is 30.93 kg/(m^2). continue sustainable diet and lifestyle changes.

## 2015-05-01 NOTE — Progress Notes (Signed)
Pre visit review using our clinic review tool, if applicable. No additional management support is needed unless otherwise documented below in the visit note. 

## 2015-05-01 NOTE — Assessment & Plan Note (Signed)
Reviewed #s. Significant improvement. Continue lipitor 40mg  daily.

## 2015-05-01 NOTE — Telephone Encounter (Signed)
Naprosyn sent to pharmacy. Recommend OTC moisturizing cream like aveeno or eucerin first, and update [place] if persistent rash for Rx cream.

## 2015-05-01 NOTE — Assessment & Plan Note (Signed)
Chronic, stable despite recent weight loss. Continue current dose levothyroxine.

## 2015-05-01 NOTE — Assessment & Plan Note (Signed)
Discussed etiology. Treat with tennis elbow strap, nsaid course, rest of L arm, and stretching exercises from [facility] pt advisor. Update if not improved with this.

## 2015-05-01 NOTE — Assessment & Plan Note (Signed)
?  seborrheic dermatitis ?irritant dermatitis from retinol use. Stop retinol, start moisturizing cream like aveeno/eucerin. Not painful or tender, doubt cellulitis.  Update if not improving and depending on changing sxs consider steroid vs abx cream.

## 2015-05-01 NOTE — Progress Notes (Signed)
BP 124/82 mmHg  Pulse 68  Temp(Src) 98.1 F (36.7 C) (Oral)  Ht 5' 6.5" (1.689 m)  Wt 194 lb 8 oz (88.225 kg)  BMI 30.93 kg/m2   CC: CPE  Subjective:    Patient ID: Cheryl Brady, female    DOB: [birth year], 54 y.o.   MRN: [id number]  HPI: Cheryl Brady is a 54 y.o. female presenting on 05/01/2015 for Annual Exam   Has lost 33lbs in 5 months! Total 54 lbs. Portion sizes, using LoseIt app.  DM has resolved! A1c 5.3%.   2 mo h/o L lateral elbow pain sore to touch. Denies inciting trauma/injury. No shoulder pain or wrist pain. Does significant packaging for work. Compression sleeve hasn't helped. Tylenol not helping. ibuporfen 681m not helping.   Erythematous patch R forehead. Not tender or itchy. Scaly and warm. No new detergents, soaps or shampoos. Had just started retinol OTC. No new medications.  [place] light wheat. Body mass index is 30.93 kg/(m^2).   Preventative: Well woman - requests referral for OBGYN. Mammogram - overdue, last 2010. Does breast exams at home.  Colon cancer - discussed. Requests stool kit. Declines flu shot. Declines pneumovax. Tdap 2013. Sunscreen use discussed.  Seatbelt use discussed.   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 Interim medical history since our last visit reviewed. Allergies and medications reviewed and updated. Current Outpatient Prescriptions on File Prior to Visit  Medication Sig  . atorvastatin (LIPITOR) 40 MG tablet Take 1 tablet (40 mg total) by mouth daily.  .Marland Kitchenglucose blood (FREESTYLE LITE) test strip 1 each by Other route 2 (two) times daily. Freestyle Freedom Lite. 240.02  . levothyroxine (SYNTHROID, LEVOTHROID) 137 MCG tablet Take 1 tablet (137  mcg total) by mouth daily.  .Marland Kitchenlisinopril (PRINIVIL,ZESTRIL) 10 MG tablet Take 1 tablet (10 mg total) by mouth daily.   No current facility-administered medications on file prior to visit.    Review of Systems  Constitutional: Negative for fever, chills, activity change, appetite change, fatigue and unexpected weight change.  HENT: Negative for hearing loss.   Eyes: Negative for visual disturbance.  Respiratory: Negative for cough, chest tightness, shortness of breath and wheezing.   Cardiovascular: Negative for chest pain, palpitations and leg swelling.  Gastrointestinal: Negative for nausea, vomiting, abdominal pain, diarrhea, constipation, blood in stool and abdominal distention.  Genitourinary: Negative for hematuria and difficulty urinating.  Musculoskeletal: Negative for myalgias, arthralgias and neck pain.  Skin: Negative for rash.  Neurological: Negative for dizziness, seizures, syncope and headaches.  Hematological: Negative for adenopathy. Does not bruise/bleed easily.  Psychiatric/Behavioral: Negative for dysphoric mood. The patient is not nervous/anxious.    Per HPI unless specifically indicated above     Objective:    BP 124/82 mmHg  Pulse 68  Temp(Src) 98.1 F (36.7 C) (Oral)  Ht 5' 6.5" (1.689 m)  Wt 194 lb 8 oz (88.225 kg)  BMI 30.93 kg/m2  Wt Readings from Last 3 Encounters:  05/01/15 194 lb 8 oz (88.225 kg)  12/26/14 227 lb 8 oz (103.193 kg)  10/25/14 243 lb (110.224 kg)    Physical Exam  Constitutional: She is oriented to person, place, and time. She appears well-developed and well-nourished. No distress.  HENT:  Head: Normocephalic and atraumatic.  Right Ear: Hearing,  tympanic membrane, external ear and ear canal normal.  Left Ear: Hearing, tympanic membrane, external ear and ear canal normal.  Nose: Nose normal.  Mouth/Throat: Uvula is midline, oropharynx is clear and moist and mucous membranes are normal. No oropharyngeal exudate, posterior  oropharyngeal edema or posterior oropharyngeal erythema.  Eyes: Conjunctivae and EOM are normal. Pupils are equal, round, and reactive to light. No scleral icterus.  Neck: Normal range of motion. Neck supple. No thyromegaly present.  Cardiovascular: Normal rate, regular rhythm, normal heart sounds and intact distal pulses.   No murmur heard. Pulses:      Radial pulses are 2+ on the right side, and 2+ on the left side.  Pulmonary/Chest: Effort normal and breath sounds normal. No respiratory distress. She has no wheezes. She has no rales.  Abdominal: Soft. Bowel sounds are normal. She exhibits no distension and no mass. There is no tenderness. There is no rebound and no guarding.  Musculoskeletal: Normal range of motion. She exhibits no edema.  Tender to palpation L lateral epicondyle. Worse pain with forced pronation and wrist flexion against resistance.  Lymphadenopathy:    She has no cervical adenopathy.  Neurological: She is alert and oriented to person, place, and time.  CN grossly intact, station and gait intact  Skin: Skin is warm and dry. Rash noted.  Erythematous patch R forehead with minimal scaling  Psychiatric: She has a normal mood and affect. Her behavior is normal. Judgment and thought content normal.  Nursing note and vitals reviewed.  Results for orders placed or performed in visit on 04/24/15  Lipid panel  Result Value Ref Range   Cholesterol 185 0 - 200 mg/dL   Triglycerides 180.0 (H) 0.0 - 149.0 mg/dL   HDL 39.60 >39.00 mg/dL   VLDL 36.0 0.0 - 40.0 mg/dL   LDL Cholesterol 109 (H) 0 - 99 mg/dL   Total CHOL/HDL Ratio 5    NonHDL 145.40   Hemoglobin A1c  Result Value Ref Range   Hgb A1c MFr Bld 5.3 4.6 - 6.5 %  TSH  Result Value Ref Range   TSH 3.11 0.35 - 4.50 uIU/mL      Assessment & Plan:   Problem List Items Addressed This Visit    RESOLVED: Diabetes type 2, uncontrolled    With weight loss and healthy diet changes, another 33lbs lost. No longer diabetic.  Will remove from problem list.      Healthcare maintenance - Primary    Preventative protocols reviewed and updated unless pt declined. Discussed healthy diet and lifestyle.       Hyperlipidemia    Reviewed #s. Significant improvement. Continue lipitor 80m daily.      Hypertension    Chronic, stable. Continue current regimen.      Hypothyroid    Chronic, stable despite recent weight loss. Continue current dose levothyroxine.      Left tennis elbow    Discussed etiology. Treat with tennis elbow strap, nsaid course, rest of L arm, and stretching exercises from [facility] pt advisor. Update if not improved with this.      Obesity, Class I, BMI 30-34.9    Marked improvement! Body mass index is 30.93 kg/(m^2). continue sustainable diet and lifestyle changes.      Skin rash    ?seborrheic dermatitis ?irritant dermatitis from retinol use. Stop retinol, start moisturizing cream like aveeno/eucerin. Not painful or tender, doubt cellulitis.  Update if not improving and depending on changing sxs consider steroid vs abx cream.  Other Visit Diagnoses    Special screening for malignant neoplasms, colon        Relevant Orders    Fecal occult blood, imunochemical    Encounter for routine gynecological examination        Relevant Orders    Ambulatory referral to Gynecology    Screening for breast cancer        Relevant Orders    MM DIGITAL SCREENING BILATERAL        Follow up plan: Return in about 6 months (around 10/31/2015) for follow up visit.

## 2015-05-01 NOTE — Assessment & Plan Note (Signed)
Preventative protocols reviewed and updated unless pt declined. Discussed healthy diet and lifestyle.  

## 2015-05-01 NOTE — Assessment & Plan Note (Signed)
Chronic, stable. Continue current regimen. 

## 2015-05-01 NOTE — Telephone Encounter (Signed)
Tim pts husband left v/m wanting to ck on 2 rxs for naproxen and a medicated ointment. Pt seen 05/01/15 and see anti inflammatory course in AVS; ? About ointment. Tim request cb and wants to know if meds being sent to [facility] [address] or [place].Please advise.

## 2015-05-01 NOTE — Patient Instructions (Addendum)
Congratulations! No longer diabetic. Stop invokana. For left tennis elbow - stretching exercises, tennis elbow strap, anti inflammatory course. Pass by lab for a stool kit. Pass by allison's office to schedule mammogram and well woman exam. Return in 6 months for follow up.

## 2015-05-01 NOTE — Telephone Encounter (Signed)
Message left advising patient/husband.

## 2015-05-09 ENCOUNTER — Ambulatory Visit
Admission: RE | Admit: 2015-05-09 | Discharge: 2015-05-09 | Disposition: A | Discharge status: Home / Self Care | Source: Ambulatory Visit | Attending: Family Medicine | Admitting: Family Medicine

## 2015-05-09 DIAGNOSIS — Z1231 Encounter for screening mammogram for malignant neoplasm of breast: Secondary | ICD-10-CM | POA: Diagnosis not present

## 2015-05-09 DIAGNOSIS — Z1239 Encounter for other screening for malignant neoplasm of breast: Secondary | ICD-10-CM

## 2015-05-09 DIAGNOSIS — R921 Mammographic calcification found on diagnostic imaging of breast: Secondary | ICD-10-CM | POA: Diagnosis not present

## 2015-05-12 ENCOUNTER — Other Ambulatory Visit: Payer: Self-pay | Admitting: Family Medicine

## 2015-05-12 DIAGNOSIS — R928 Other abnormal and inconclusive findings on diagnostic imaging of breast: Secondary | ICD-10-CM

## 2015-05-16 ENCOUNTER — Ambulatory Visit

## 2015-05-16 ENCOUNTER — Ambulatory Visit
Admission: RE | Admit: 2015-05-16 | Discharge: 2015-05-16 | Disposition: A | Discharge status: Home / Self Care | Source: Ambulatory Visit | Attending: Family Medicine | Admitting: Family Medicine

## 2015-05-16 DIAGNOSIS — R928 Other abnormal and inconclusive findings on diagnostic imaging of breast: Secondary | ICD-10-CM | POA: Diagnosis present

## 2015-05-16 DIAGNOSIS — R921 Mammographic calcification found on diagnostic imaging of breast: Secondary | ICD-10-CM | POA: Diagnosis present

## 2015-05-16 LAB — HM MAMMOGRAPHY

## 2015-05-20 ENCOUNTER — Encounter: Payer: Self-pay | Admitting: *Deleted

## 2015-06-24 ENCOUNTER — Encounter: Payer: PRIVATE HEALTH INSURANCE | Admitting: Obstetrics & Gynecology

## 2015-07-03 ENCOUNTER — Encounter: Payer: Self-pay | Admitting: Obstetrics & Gynecology

## 2015-07-03 ENCOUNTER — Ambulatory Visit (INDEPENDENT_AMBULATORY_CARE_PROVIDER_SITE_OTHER): Admitting: Obstetrics & Gynecology

## 2015-07-03 VITALS — BP 120/77 | HR 91 | Resp 16 | Ht 67.0 in | Wt 180.0 lb

## 2015-07-03 DIAGNOSIS — Z01419 Encounter for gynecological examination (general) (routine) without abnormal findings: Secondary | ICD-10-CM

## 2015-07-03 DIAGNOSIS — Z0000 Encounter for general adult medical examination without abnormal findings: Secondary | ICD-10-CM

## 2015-07-03 NOTE — Progress Notes (Signed)
Subjective:    Cheryl Brady is a 54 y.o. MW P3 (28,33, and 66 yo kids, 5 grands) female who presents for an annual exam. The patient has no gyn complaints today. The patient is sexually active. GYN screening history: last pap: was normal. The patient wears seatbelts: yes. The patient participates in regular exercise: yes. (walking. Has lost 70#) 3.Has the patient ever been transfused or tattooed?: no. The patient reports that there is not domestic violence in her life.   Menstrual History: OB History    Gravida Para Term Preterm AB TAB SAB Ectopic Multiple Living   3 3 3       3       Menarche age: 77 No LMP recorded. Patient has had a hysterectomy.    The following portions of the patient's history were reviewed and updated as appropriate: allergies, current medications, past family history, past medical history, past social history, past surgical history and problem list.  Review of Systems A comprehensive review of systems was negative. Married for 28 years, denies dyspareunia. Doesn't need lubricant.   Objective:    BP 120/77 mmHg  Pulse 91  Resp 16  Ht 5\' 7"  (1.702 m)  Wt 180 lb (81.647 kg)  BMI 28.19 kg/m2  General Appearance:    Alert, cooperative, no distress, appears stated age  Head:    Normocephalic, without obvious abnormality, atraumatic  Eyes:    PERRL, conjunctiva/corneas clear, EOM's intact, fundi    benign, both eyes  Ears:    Normal TM's and external ear canals, both ears  Nose:   Nares normal, septum midline, mucosa normal, no drainage    or sinus tenderness  Throat:   Lips, mucosa, and tongue normal; teeth and gums normal  Neck:   Supple, symmetrical, trachea midline, no adenopathy;    thyroid:  no enlargement/tenderness/nodules; no carotid   bruit or JVD  Back:     Symmetric, no curvature, ROM normal, no CVA tenderness  Lungs:     Clear to auscultation bilaterally, respirations unlabored  Chest Wall:    No tenderness or deformity   Heart:    Regular  rate and rhythm, S1 and S2 normal, no murmur, rub   or gallop  Breast Exam:    No tenderness, masses, or nipple abnormality  Abdomen:     Soft, non-tender, bowel sounds active all four quadrants,    no masses, no organomegaly  Genitalia:    Normal female without lesion, discharge or tenderness, minimal vaginal vault prolapse, no masses with bimanual exam     Extremities:   Extremities normal, atraumatic, no cyanosis or edema  Pulses:   2+ and symmetric all extremities  Skin:   Skin color, texture, turgor normal, no rashes or lesions  Lymph nodes:   Cervical, supraclavicular, and axillary nodes normal  Neurologic:   CNII-XII intact, normal strength, sensation and reflexes    throughout  .    Assessment:    Healthy female exam.    Plan:     Breast self exam technique reviewed and patient encouraged to perform self-exam monthly. Mammogram. in 6 months for follow up

## 2015-10-31 ENCOUNTER — Ambulatory Visit: Admitting: Family Medicine

## 2015-10-31 ENCOUNTER — Ambulatory Visit: Payer: PRIVATE HEALTH INSURANCE | Admitting: Family Medicine

## 2015-11-05 ENCOUNTER — Encounter: Payer: Self-pay | Admitting: Family Medicine

## 2015-11-05 ENCOUNTER — Ambulatory Visit (INDEPENDENT_AMBULATORY_CARE_PROVIDER_SITE_OTHER): Admitting: Family Medicine

## 2015-11-05 VITALS — BP 118/80 | HR 60 | Temp 98.5°F | Wt 154.5 lb

## 2015-11-05 DIAGNOSIS — I10 Essential (primary) hypertension: Secondary | ICD-10-CM

## 2015-11-05 DIAGNOSIS — R928 Other abnormal and inconclusive findings on diagnostic imaging of breast: Secondary | ICD-10-CM | POA: Diagnosis not present

## 2015-11-05 DIAGNOSIS — E039 Hypothyroidism, unspecified: Secondary | ICD-10-CM

## 2015-11-05 DIAGNOSIS — E669 Obesity, unspecified: Secondary | ICD-10-CM

## 2015-11-05 DIAGNOSIS — S29012A Strain of muscle and tendon of back wall of thorax, initial encounter: Secondary | ICD-10-CM

## 2015-11-05 DIAGNOSIS — L659 Nonscarring hair loss, unspecified: Secondary | ICD-10-CM

## 2015-11-05 DIAGNOSIS — R921 Mammographic calcification found on diagnostic imaging of breast: Secondary | ICD-10-CM

## 2015-11-05 LAB — CBC WITH DIFFERENTIAL/PLATELET
BASOS PCT: 0.4 % (ref 0.0–3.0)
Basophils Absolute: 0 10*3/uL (ref 0.0–0.1)
EOS PCT: 2.3 % (ref 0.0–5.0)
Eosinophils Absolute: 0.2 10*3/uL (ref 0.0–0.7)
HCT: 44.1 % (ref 36.0–46.0)
Hemoglobin: 14.6 g/dL (ref 12.0–15.0)
LYMPHS ABS: 3.1 10*3/uL (ref 0.7–4.0)
Lymphocytes Relative: 33.3 % (ref 12.0–46.0)
MCHC: 33.2 g/dL (ref 30.0–36.0)
MCV: 91.3 fl (ref 78.0–100.0)
MONOS PCT: 7.2 % (ref 3.0–12.0)
Monocytes Absolute: 0.7 10*3/uL (ref 0.1–1.0)
NEUTROS ABS: 5.2 10*3/uL (ref 1.4–7.7)
NEUTROS PCT: 56.8 % (ref 43.0–77.0)
PLATELETS: 276 10*3/uL (ref 150.0–400.0)
RBC: 4.83 Mil/uL (ref 3.87–5.11)
RDW: 13.5 % (ref 11.5–15.5)
WBC: 9.2 10*3/uL (ref 4.0–10.5)

## 2015-11-05 LAB — TSH: TSH: 0.4 u[IU]/mL (ref 0.35–4.50)

## 2015-11-05 LAB — FERRITIN: Ferritin: 332.6 ng/mL — ABNORMAL HIGH (ref 10.0–291.0)

## 2015-11-05 LAB — T4, FREE: Free T4: 1.1 ng/dL (ref 0.60–1.60)

## 2015-11-05 NOTE — Assessment & Plan Note (Signed)
After remote injury, now exacerbated. Provided with exercises from [facility] pt advisor. Discussed ok to continue motrin. If persistent will refer to PT. If worsening, update sooner for further eval with blood work.

## 2015-11-05 NOTE — Assessment & Plan Note (Signed)
Chronic, with weight loss will need to recheck TFTs. ?leading to hair loss

## 2015-11-05 NOTE — Patient Instructions (Addendum)
Cut lisinopril in half and monitor blood pressure for 1-2 weeks. If staying well controlled, ok to stop lisinopril.  Pass by our referral coordinators to schedule mammogram.  Labwork today.  For back pain - i think you have rhomboid strain - treat with motrin and exercise and ok to continue heating pad. If no improvement we will set you up with physical therapy. If worsening let me know for xray and further evaluation.

## 2015-11-05 NOTE — Progress Notes (Signed)
BP 118/80 mmHg  Pulse 60  Temp(Src) 98.5 F (36.9 C) (Oral)  Wt 154 lb 8 oz (70.081 kg)   CC: f/u visit  Subjective:    Patient ID: Cheryl Brady, female    DOB: 05/04/1961, 54 y.o.   MRN: [id number]  HPI: Cheryl Brady is a 54 y.o. female presenting on 11/05/2015 for Follow-up   Lost another 40lbs in 6 months! Total 94 lb weight loss. DM has resolved, blood pressure improved. Noticing hair falling out. No skin changes, diarrhea. Some heat intolerance nightly at 8pm. No night sweats.   HTN - great control on lisinopril 10mg  daily. Asks about lower dose.  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 Exacerbated 5 wks ago after 1.5hr car ride. No radiation down legs or arms. Right below shoulder blade. No abd pain. No fevers/chills. Continues walking on treadmill - limited by back pain.  Saw OBGYN 06/2015 - normal exam. Mammogram 05/16/2015 - ?benign bilateral breast calcifications, Birads 3. rec rpt mammo 6 mo f/u.   Hypothyroid - complaint with levothyroxine 188mcg daily. See above. Lab Results  Component Value Date   TSH 3.11 04/24/2015     Relevant past medical, surgical, family and social history reviewed and updated as indicated. Interim medical history since our last visit reviewed. Allergies and medications reviewed and updated. Current Outpatient Prescriptions on File Prior to Visit  Medication Sig  . levothyroxine (SYNTHROID, LEVOTHROID) 137 MCG tablet Take 1 tablet (137 mcg total) by mouth daily.  Marland Kitchen lisinopril (PRINIVIL,ZESTRIL) 10 MG tablet Take 1 tablet (10 mg total) by mouth daily.   No current facility-administered medications on file prior to visit.    Review of Systems Per HPI unless specifically indicated in ROS section     Objective:    BP 118/80 mmHg  Pulse 60  Temp(Src) 98.5 F (36.9 C) (Oral)  Wt 154 lb 8 oz (70.081 kg)  Wt Readings from Last 3 Encounters:  11/05/15 154 lb 8 oz  (70.081 kg)  07/03/15 180 lb (81.647 kg)  05/01/15 194 lb 8 oz (88.225 kg)   Body mass index is 24.19 kg/(m^2).  Physical Exam  Constitutional: She appears well-developed and well-nourished. No distress.  HENT:  Mouth/Throat: Oropharynx is clear and moist. No oropharyngeal exudate.  Cardiovascular: Normal rate, regular rhythm, normal heart sounds and intact distal pulses.   No murmur heard. Pulmonary/Chest: Effort normal and breath sounds normal. No respiratory distress. She has no wheezes. She has no rales.  Musculoskeletal: She exhibits no edema.  No midline cervical or thoracic spine tenderness + point tender to palpation R rhomboid muscles and tender around R scapula  Skin: Skin is warm and dry. No rash noted.  No vesicular rash appreciated  Psychiatric: She has a normal mood and affect.  Nursing note and vitals reviewed.  Results for orders placed or performed in visit on 05/20/15  HM MAMMOGRAPHY  Result Value Ref Range   HM Mammogram Probably benign-Birads 3--**REPEAT 6 MONTHS**       Assessment & Plan:   Problem List Items Addressed This Visit    Rhomboid muscle strain - Primary    After remote injury, now exacerbated. Provided with exercises from [facility] pt advisor. Discussed ok to continue motrin. If persistent will refer to PT. If worsening, update sooner for further eval with blood work.      RESOLVED: Obesity, Class I, BMI 30-34.9    BMI now normal range.  Total 94 lbs over the past 9 months. Discussed sustainability of diet and lifestyle changes. Congratulated on work to date!      Hypothyroid    Chronic, with weight loss will need to recheck TFTs. ?leading to hair loss      Relevant Orders   TSH   T4, free   Hypertension    Chronic, great control with weight loss. Will trial taper off lisinopril.       Breast calcifications on mammogram    Due for rpt mammogram. Ordered.      Relevant Orders   MM Digital Diagnostic Bilat   US BREAST COMPLETE UNI RIGHT INC  AXILLA   US BREAST COMPLETE UNI LEFT INC AXILLA    Other Visit Diagnoses    Hair loss        Relevant Orders    CBC with Differential/Platelet    Ferritin        Follow up plan: Return in about 6 months (around 05/05/2016), or as needed, for annual exam, prior fasting for blood work.

## 2015-11-05 NOTE — Assessment & Plan Note (Signed)
Chronic, great control with weight loss. Will trial taper off lisinopril.

## 2015-11-05 NOTE — Progress Notes (Signed)
Pre visit review using our clinic review tool, if applicable. No additional management support is needed unless otherwise documented below in the visit note. 

## 2015-11-05 NOTE — Assessment & Plan Note (Signed)
Due for rpt mammogram. Ordered.

## 2015-11-05 NOTE — Assessment & Plan Note (Signed)
BMI now normal range. Total 94 lbs over the past 9 months. Discussed sustainability of diet and lifestyle changes. Congratulated on work to date!

## 2015-11-06 ENCOUNTER — Other Ambulatory Visit: Payer: Self-pay | Admitting: Family Medicine

## 2015-11-06 MED ORDER — LEVOTHYROXINE SODIUM 125 MCG PO TABS: 125.0000 ug | ORAL_TABLET | Freq: Every day | ORAL | Status: DC

## 2015-11-07 ENCOUNTER — Encounter: Payer: Self-pay | Admitting: *Deleted

## 2015-12-10 ENCOUNTER — Inpatient Hospital Stay: Admission: RE | Admit: 2015-12-10 | Source: Ambulatory Visit

## 2015-12-10 ENCOUNTER — Ambulatory Visit

## 2016-02-23 ENCOUNTER — Telehealth: Payer: Self-pay

## 2016-02-23 NOTE — Telephone Encounter (Signed)
Tim pts husband said nothing further needed; mail order med came in already.

## 2016-02-23 NOTE — Telephone Encounter (Signed)
PLEASE NOTE: All timestamps contained within this report are represented as [place] Standard Time. CONFIDENTIALTY NOTICE: This fax transmission is intended only for the addressee. It contains information that is legally privileged, confidential or otherwise protected from use or disclosure. If you are not the intended recipient, you are strictly prohibited from reviewing, disclosing, copying using or disseminating any of this information or taking any action in reliance on or regarding this information. If you have received this fax in error, please notify us immediately by telephone so that we can arrange for its return to [place]. Phone: [phone], Toll-Free: [phone], Fax: [phone] Page: 1 of 1 Call Id: [id number] [place] Patient Name: Cheryl Brady Gender: Female DOB: Apr 02, 1961 Age: 55 Y 9 M 18 D Return Phone Number: [phone] (Primary) Address: City/State/Zip: [place] Client [place] Night - Client Client Site [place] Physician [name] Contact Type Call Who Is Calling Patient / Member / Family / Caregiver Call Type Triage / Clinical Caller Name Kynley Misher Relationship To Patient Spouse Return Phone Number [phone] (Primary) Chief Complaint Prescription Refill or Medication Request (non symptomatic) Reason for Call Symptomatic / Request for Health Information Initial Comment caller states his wife has not received any of her medications by mail - she does not have any of it left - can she get a temporary rx called in Translation No Nurse Assessment Nurse: Einar Gip, RN, Neoma Laming Date/Time ([place] Time): 02/21/2016 8:01:25 AM Confirm and document reason for call. If symptomatic, describe symptoms. You must click the next button to save text entered. ---Caller states patient is out of synthryoid. Gets  medication via mail. Not local pharmacy fill in over a year. Advised in general no medication refills when the office is closed. Caller verbalized understanding. Has the patient traveled out of the country within the last 30 days? ---Not Applicable Does the patient have any new or worsening symptoms? ---No Guidelines Guideline Title Affirmed Question Affirmed Notes Nurse Date/Time (Eastern Time) Disp. Time Eilene Ghazi Time) Disposition Final User 02/21/2016 8:04:21 AM Clinical Call Yes Einar Gip, RN, Neoma Laming

## 2016-04-26 ENCOUNTER — Other Ambulatory Visit: Payer: Self-pay | Admitting: Family Medicine

## 2016-04-26 DIAGNOSIS — E039 Hypothyroidism, unspecified: Secondary | ICD-10-CM

## 2016-04-26 DIAGNOSIS — R739 Hyperglycemia, unspecified: Secondary | ICD-10-CM

## 2016-04-26 DIAGNOSIS — E785 Hyperlipidemia, unspecified: Secondary | ICD-10-CM

## 2016-04-26 DIAGNOSIS — I10 Essential (primary) hypertension: Secondary | ICD-10-CM

## 2016-04-28 ENCOUNTER — Other Ambulatory Visit (INDEPENDENT_AMBULATORY_CARE_PROVIDER_SITE_OTHER)

## 2016-04-28 DIAGNOSIS — I10 Essential (primary) hypertension: Secondary | ICD-10-CM

## 2016-04-28 DIAGNOSIS — E785 Hyperlipidemia, unspecified: Secondary | ICD-10-CM

## 2016-04-28 DIAGNOSIS — E039 Hypothyroidism, unspecified: Secondary | ICD-10-CM | POA: Diagnosis not present

## 2016-04-28 DIAGNOSIS — R739 Hyperglycemia, unspecified: Secondary | ICD-10-CM | POA: Diagnosis not present

## 2016-04-28 LAB — LIPID PANEL
Cholesterol: 216 mg/dL — ABNORMAL HIGH (ref 0–200)
HDL: 45.5 mg/dL (ref >39.00)
LDL CALC: 140 mg/dL — AB (ref 0–99)
NONHDL: 170.39
Total CHOL/HDL Ratio: 5
Triglycerides: 152 mg/dL — ABNORMAL HIGH (ref 0.0–149.0)
VLDL: 30.4 mg/dL (ref 0.0–40.0)

## 2016-04-28 LAB — BASIC METABOLIC PANEL
BUN: 11 mg/dL (ref 6–23)
CHLORIDE: 104 meq/L (ref 96–112)
CO2: 31 mEq/L (ref 19–32)
CREATININE: 0.59 mg/dL (ref 0.40–1.20)
Calcium: 9.8 mg/dL (ref 8.4–10.5)
GFR: 112.9 mL/min (ref >60.00)
GLUCOSE: 81 mg/dL (ref 70–99)
POTASSIUM: 4.4 meq/L (ref 3.5–5.1)
Sodium: 139 mEq/L (ref 135–145)

## 2016-04-28 LAB — HEMOGLOBIN A1C: Hgb A1c MFr Bld: 5.1 % (ref 4.6–6.5)

## 2016-04-28 LAB — TSH: TSH: 5.18 u[IU]/mL — AB (ref 0.35–4.50)

## 2016-05-05 ENCOUNTER — Encounter: Admitting: Family Medicine

## 2016-05-13 ENCOUNTER — Encounter: Payer: Self-pay | Admitting: Family Medicine

## 2016-05-13 ENCOUNTER — Telehealth: Payer: Self-pay | Admitting: Family Medicine

## 2016-05-13 ENCOUNTER — Ambulatory Visit (INDEPENDENT_AMBULATORY_CARE_PROVIDER_SITE_OTHER): Admitting: Family Medicine

## 2016-05-13 VITALS — BP 128/82 | HR 88 | Temp 98.1°F | Ht 65.5 in | Wt 148.0 lb

## 2016-05-13 DIAGNOSIS — E785 Hyperlipidemia, unspecified: Secondary | ICD-10-CM

## 2016-05-13 DIAGNOSIS — E039 Hypothyroidism, unspecified: Secondary | ICD-10-CM | POA: Diagnosis not present

## 2016-05-13 DIAGNOSIS — I10 Essential (primary) hypertension: Secondary | ICD-10-CM | POA: Diagnosis not present

## 2016-05-13 DIAGNOSIS — R634 Abnormal weight loss: Secondary | ICD-10-CM

## 2016-05-13 DIAGNOSIS — Z0000 Encounter for general adult medical examination without abnormal findings: Secondary | ICD-10-CM | POA: Diagnosis not present

## 2016-05-13 DIAGNOSIS — R928 Other abnormal and inconclusive findings on diagnostic imaging of breast: Secondary | ICD-10-CM | POA: Diagnosis not present

## 2016-05-13 DIAGNOSIS — R921 Mammographic calcification found on diagnostic imaging of breast: Secondary | ICD-10-CM

## 2016-05-13 DIAGNOSIS — Z1211 Encounter for screening for malignant neoplasm of colon: Secondary | ICD-10-CM

## 2016-05-13 MED ORDER — LEVOTHYROXINE SODIUM 137 MCG PO TABS: 137.0000 ug | ORAL_TABLET | Freq: Every day | ORAL | Status: DC

## 2016-05-13 MED ORDER — LEVOTHYROXINE SODIUM 137 MCG PO CAPS: 137.0000 ug | ORAL_CAPSULE | Freq: Every day | ORAL | Status: DC

## 2016-05-13 NOTE — Progress Notes (Signed)
BP 128/82 mmHg  Pulse 88  Temp(Src) 98.1 F (36.7 C) (Oral)  Ht 5' 5.5" (1.664 m)  Wt 148 lb (67.132 kg)  BMI 24.25 kg/m2   CC: CPE  Subjective:    Patient ID: Cheryl Brady, female    DOB: [birth year], 55 y.o.   MRN: [id number]  HPI: Cheryl Brady is a 55 y.o. female presenting on 05/13/2016 for Annual Exam   Same lifestyle/diet changes over last 2 years. Total 100lb weight loss. Maintaining weight now.   Ongoing R thoracic back pain - see prior note for details.   Endorses some hypothyroid symptoms of hair loss, cold intolerance, constipation.   Preventative: Well woman - Dr Hulan Fray 06/2015, normal exam/pap.  Mammogram - 04/2015 with bilateral breast calcifications. Did not return - had bad experience with multiple accounts and being sent to collections.  Colon cancer - discussed. Requests stool kit. Did not return last year.  Declines flu shot. Declines pneumovax. Tdap 2013. Sunscreen use discussed. No changing moles on skin.  Seatbelt use discussed.   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 Interim medical history since our last visit reviewed. Allergies and medications reviewed and updated. No current outpatient prescriptions on file prior to visit.   No current facility-administered medications on file prior to visit.    Review of Systems  Constitutional: Negative for fever, chills, activity change, appetite change, fatigue and unexpected weight change.  HENT: Negative for hearing loss.   Eyes: Negative for visual disturbance.  Respiratory: Negative for cough, chest tightness, shortness of breath and wheezing.   Cardiovascular: Negative for chest pain, palpitations and leg swelling.    Gastrointestinal: Negative for nausea, vomiting, abdominal pain, diarrhea, constipation, blood in stool and abdominal distention.  Genitourinary: Negative for hematuria and difficulty urinating.  Musculoskeletal: Negative for myalgias, arthralgias and neck pain.  Skin: Negative for rash.  Neurological: Negative for dizziness, seizures, syncope and headaches.  Hematological: Negative for adenopathy. Does not bruise/bleed easily.  Psychiatric/Behavioral: Negative for dysphoric mood. The patient is not nervous/anxious.    Per HPI unless specifically indicated in ROS section     Objective:    BP 128/82 mmHg  Pulse 88  Temp(Src) 98.1 F (36.7 C) (Oral)  Ht 5' 5.5" (1.664 m)  Wt 148 lb (67.132 kg)  BMI 24.25 kg/m2  Wt Readings from Last 3 Encounters:  05/13/16 148 lb (67.132 kg)  11/05/15 154 lb 8 oz (70.081 kg)  07/03/15 180 lb (81.647 kg)    Physical Exam  Constitutional: She is oriented to person, place, and time. She appears well-developed and well-nourished. No distress.  HENT:  Head: Normocephalic and atraumatic.  Right Ear: Hearing, tympanic membrane, external ear and ear canal normal.  Left Ear: Hearing, tympanic membrane, external ear and ear canal normal.  Nose: Nose normal.  Mouth/Throat: Uvula is midline, oropharynx is clear and moist and mucous membranes are normal. No oropharyngeal exudate, posterior oropharyngeal edema or posterior oropharyngeal erythema.  Eyes: Conjunctivae and EOM are normal. Pupils are equal, round, and reactive to light. No scleral icterus.  Neck: Normal range of motion. Neck supple. No thyromegaly present.  Cardiovascular: Normal rate, regular rhythm, normal heart sounds and intact distal pulses.   No murmur heard. Pulses:      Radial pulses are 2+  on the right side, and 2+ on the left side.  Pulmonary/Chest: Effort normal and breath sounds normal. No respiratory distress. She has no wheezes. She has no rales.  Abdominal: Soft. Bowel sounds are  normal. She exhibits no distension and no mass. There is no tenderness. There is no rebound and no guarding.  Genitourinary:  GYN - per OBGYN  Musculoskeletal: Normal range of motion. She exhibits no edema.  Lymphadenopathy:    She has no cervical adenopathy.  Neurological: She is alert and oriented to person, place, and time.  CN grossly intact, station and gait intact  Skin: Skin is warm and dry. No rash noted.  Psychiatric: She has a normal mood and affect. Her behavior is normal. Judgment and thought content normal.  Nursing note and vitals reviewed.  Results for orders placed or performed in visit on 04/28/16  Lipid panel  Result Value Ref Range   Cholesterol 216 (H) 0 - 200 mg/dL   Triglycerides 152.0 (H) 0.0 - 149.0 mg/dL   HDL 45.50 >39.00 mg/dL   VLDL 30.4 0.0 - 40.0 mg/dL   LDL Cholesterol 140 (H) 0 - 99 mg/dL   Total CHOL/HDL Ratio 5    NonHDL 170.39   TSH  Result Value Ref Range   TSH 5.18 (H) 0.35 - 4.50 uIU/mL  Hemoglobin A1c  Result Value Ref Range   Hgb A1c MFr Bld 5.1 4.6 - 6.5 %  Basic metabolic panel  Result Value Ref Range   Sodium 139 135 - 145 mEq/L   Potassium 4.4 3.5 - 5.1 mEq/L   Chloride 104 96 - 112 mEq/L   CO2 31 19 - 32 mEq/L   Glucose, Bld 81 70 - 99 mg/dL   BUN 11 6 - 23 mg/dL   Creatinine, Ser 0.59 0.40 - 1.20 mg/dL   Calcium 9.8 8.4 - 10.5 mg/dL   GFR 112.90 >60.00 mL/min      Assessment & Plan:   Problem List Items Addressed This Visit    Hypothyroid    TSH slightly overactive - will increase levothyroxine to 150mg daily, recheck levels in 2 months.       Relevant Medications   levothyroxine (SYNTHROID, LEVOTHROID) 137 MCG tablet   Other Relevant Orders   TSH   T4, free   Healthcare maintenance - Primary    Preventative protocols reviewed and updated unless pt declined. Discussed healthy diet and lifestyle.       Hyperlipidemia    Chronic, mildly deteriorated over last year. Discussed healthy diet changes to control LDL  levels.       Breast calcifications on mammogram    Overdue for f/u - [facility]       Relevant Orders   MM Digital Diagnostic Bilat   RESOLVED: Hypertension    Resolved with weight loss.        Other Visit Diagnoses    Special screening for malignant neoplasms, colon        Relevant Orders    Fecal occult blood, imunochemical    Loss of weight        Relevant Orders    Hepatic function panel    CBC with Differential/Platelet        Follow up plan: Return in about 6 months (around 11/12/2016), or as needed, for follow up visit.  JRia Bush MD

## 2016-05-13 NOTE — Assessment & Plan Note (Signed)
Chronic, mildly deteriorated over last year. Discussed healthy diet changes to control LDL levels.

## 2016-05-13 NOTE — Assessment & Plan Note (Signed)
Resolved with weight loss.   

## 2016-05-13 NOTE — Telephone Encounter (Signed)
i need ultra sound order for diagnostic mammogram img [id number] [phone] thanks

## 2016-05-13 NOTE — Patient Instructions (Addendum)
Pass by [facility] to schedule diagnostic mammogram. Pass by lab to pick up stool kit.  Return in 2-3 months for labs Return as needed or in 6 months for follow up visit.  Health Maintenance, Female Adopting a healthy lifestyle and getting preventive care can go a long way to promote health and wellness. Talk with your health care provider about what schedule of regular examinations is right for you. This is a good chance for you to check in with your provider about disease prevention and staying healthy. In between checkups, there are plenty of things you can do on your own. Experts have done a lot of research about which lifestyle changes and preventive measures are most likely to keep you healthy. Ask your health care provider for more information. WEIGHT AND DIET  Eat a healthy diet  Be sure to include plenty of vegetables, fruits, low-fat dairy products, and lean protein.  Do not eat a lot of foods high in solid fats, added sugars, or salt.  Get regular exercise. This is one of the most important things you can do for your health.  Most adults should exercise for at least 150 minutes each week. The exercise should increase your heart rate and make you sweat (moderate-intensity exercise).  Most adults should also do strengthening exercises at least twice a week. This is in addition to the moderate-intensity exercise.  Maintain a healthy weight  Body mass index (BMI) is a measurement that can be used to identify possible weight problems. It estimates body fat based on height and weight. Your health care provider can help determine your BMI and help you achieve or maintain a healthy weight.  For females 77 years of age and older:   A BMI below 18.5 is considered underweight.  A BMI of 18.5 to 24.9 is normal.  A BMI of 25 to 29.9 is considered overweight.  A BMI of 30 and above is considered obese.  Watch levels of cholesterol and blood lipids  You should start having your  blood tested for lipids and cholesterol at 54 years of age, then have this test every 5 years.  You may need to have your cholesterol levels checked more often if:  Your lipid or cholesterol levels are high.  You are older than 54 years of age.  You are at high risk for heart disease.  CANCER SCREENING   Lung Cancer  Lung cancer screening is recommended for adults 19-33 years old who are at high risk for lung cancer because of a history of smoking.  A yearly low-dose CT scan of the lungs is recommended for people who:  Currently smoke.  Have quit within the past 15 years.  Have at least a 30-pack-year history of smoking. A pack year is smoking an average of one pack of cigarettes a day for 1 year.  Yearly screening should continue until it has been 15 years since you quit.  Yearly screening should stop if you develop a health problem that would prevent you from having lung cancer treatment.  Breast Cancer  Practice breast self-awareness. This means understanding how your breasts normally appear and feel.  It also means doing regular breast self-exams. Let your health care provider know about any changes, no matter how small.  If you are in your 20s or 30s, you should have a clinical breast exam (CBE) by a health care provider every 1-3 years as part of a regular health exam.  If you are 40 or older, have  a CBE every year. Also consider having a breast X-ray (mammogram) every year.  If you have a family history of breast cancer, talk to your health care provider about genetic screening.  If you are at high risk for breast cancer, talk to your health care provider about having an MRI and a mammogram every year.  Breast cancer gene (BRCA) assessment is recommended for women who have family members with BRCA-related cancers. BRCA-related cancers include:  Breast.  Ovarian.  Tubal.  Peritoneal cancers.  Results of the assessment will determine the need for genetic  counseling and BRCA1 and BRCA2 testing. Cervical Cancer Your health care provider may recommend that you be screened regularly for cancer of the pelvic organs (ovaries, uterus, and vagina). This screening involves a pelvic examination, including checking for microscopic changes to the surface of your cervix (Pap test). You may be encouraged to have this screening done every 3 years, beginning at age 21.  For women ages 30-65, health care providers may recommend pelvic exams and Pap testing every 3 years, or they may recommend the Pap and pelvic exam, combined with testing for human papilloma virus (HPV), every 5 years. Some types of HPV increase your risk of cervical cancer. Testing for HPV may also be done on women of any age with unclear Pap test results.  Other health care providers may not recommend any screening for nonpregnant women who are considered low risk for pelvic cancer and who do not have symptoms. Ask your health care provider if a screening pelvic exam is right for you.  If you have had past treatment for cervical cancer or a condition that could lead to cancer, you need Pap tests and screening for cancer for at least 20 years after your treatment. If Pap tests have been discontinued, your risk factors (such as having a new sexual partner) need to be reassessed to determine if screening should resume. Some women have medical problems that increase the chance of getting cervical cancer. In these cases, your health care provider may recommend more frequent screening and Pap tests. Colorectal Cancer  This type of cancer can be detected and often prevented.  Routine colorectal cancer screening usually begins at 54 years of age and continues through 54 years of age.  Your health care provider may recommend screening at an earlier age if you have risk factors for colon cancer.  Your health care provider may also recommend using home test kits to check for hidden blood in the stool.  A  small camera at the end of a tube can be used to examine your colon directly (sigmoidoscopy or colonoscopy). This is done to check for the earliest forms of colorectal cancer.  Routine screening usually begins at age 50.  Direct examination of the colon should be repeated every 5-10 years through 54 years of age. However, you may need to be screened more often if early forms of precancerous polyps or small growths are found. Skin Cancer  Check your skin from head to toe regularly.  Tell your health care provider about any new moles or changes in moles, especially if there is a change in a mole's shape or color.  Also tell your health care provider if you have a mole that is larger than the size of a pencil eraser.  Always use sunscreen. Apply sunscreen liberally and repeatedly throughout the day.  Protect yourself by wearing long sleeves, pants, a wide-brimmed hat, and sunglasses whenever you are outside. HEART DISEASE, DIABETES, AND HIGH   BLOOD PRESSURE   High blood pressure causes heart disease and increases the risk of stroke. High blood pressure is more likely to develop in:  People who have blood pressure in the high end of the normal range (130-139/85-89 mm Hg).  People who are overweight or obese.  People who are African American.  If you are 18-13 years of age, have your blood pressure checked every 3-5 years. If you are 57 years of age or older, have your blood pressure checked every year. You should have your blood pressure measured twice--once when you are at a hospital or clinic, and once when you are not at a hospital or clinic. Record the average of the two measurements. To check your blood pressure when you are not at a hospital or clinic, you can use:  An automated blood pressure machine at a pharmacy.  A home blood pressure monitor.  If you are between 12 years and 71 years old, ask your health care provider if you should take aspirin to prevent strokes.  Have  regular diabetes screenings. This involves taking a blood sample to check your fasting blood sugar level.  If you are at a normal weight and have a low risk for diabetes, have this test once every three years after 54 years of age.  If you are overweight and have a high risk for diabetes, consider being tested at a younger age or more often. PREVENTING INFECTION  Hepatitis B  If you have a higher risk for hepatitis B, you should be screened for this virus. You are considered at high risk for hepatitis B if:  You were born in a country where hepatitis B is common. Ask your health care provider which countries are considered high risk.  Your parents were born in a high-risk country, and you have not been immunized against hepatitis B (hepatitis B vaccine).  You have HIV or AIDS.  You use needles to inject street drugs.  You live with someone who has hepatitis B.  You have had sex with someone who has hepatitis B.  You get hemodialysis treatment.  You take certain medicines for conditions, including cancer, organ transplantation, and autoimmune conditions. Hepatitis C  Blood testing is recommended for:  Everyone born from [birth year] through 1964.  Anyone with known risk factors for hepatitis C. Sexually transmitted infections (STIs)  You should be screened for sexually transmitted infections (STIs) including gonorrhea and chlamydia if:  You are sexually active and are younger than 54 years of age.  You are older than 54 years of age and your health care provider tells you that you are at risk for this type of infection.  Your sexual activity has changed since you were last screened and you are at an increased risk for chlamydia or gonorrhea. Ask your health care provider if you are at risk.  If you do not have HIV, but are at risk, it may be recommended that you take a prescription medicine daily to prevent HIV infection. This is called pre-exposure prophylaxis (PrEP). You are  considered at risk if:  You are sexually active and do not regularly use condoms or know the HIV status of your partner(s).  You take drugs by injection.  You are sexually active with a partner who has HIV. Talk with your health care provider about whether you are at high risk of being infected with HIV. If you choose to begin PrEP, you should first be tested for HIV. You should then be tested every  3 months for as long as you are taking PrEP.  PREGNANCY   If you are premenopausal and you may become pregnant, ask your health care provider about preconception counseling.  If you may become pregnant, take 400 to 800 micrograms (mcg) of folic acid every day.  If you want to prevent pregnancy, talk to your health care provider about birth control (contraception). OSTEOPOROSIS AND MENOPAUSE   Osteoporosis is a disease in which the bones lose minerals and strength with aging. This can result in serious bone fractures. Your risk for osteoporosis can be identified using a bone density scan.  If you are 65 years of age or older, or if you are at risk for osteoporosis and fractures, ask your health care provider if you should be screened.  Ask your health care provider whether you should take a calcium or vitamin D supplement to lower your risk for osteoporosis.  Menopause may have certain physical symptoms and risks.  Hormone replacement therapy may reduce some of these symptoms and risks. Talk to your health care provider about whether hormone replacement therapy is right for you.  HOME CARE INSTRUCTIONS   Schedule regular health, dental, and eye exams.  Stay current with your immunizations.   Do not use any tobacco products including cigarettes, chewing tobacco, or electronic cigarettes.  If you are pregnant, do not drink alcohol.  If you are breastfeeding, limit how much and how often you drink alcohol.  Limit alcohol intake to no more than 1 drink per day for nonpregnant women. One  drink equals 12 ounces of beer, 5 ounces of wine, or 1 ounces of hard liquor.  Do not use street drugs.  Do not share needles.  Ask your health care provider for help if you need support or information about quitting drugs.  Tell your health care provider if you often feel depressed.  Tell your health care provider if you have ever been abused or do not feel safe at home.   This information is not intended to replace advice given to you by your health care provider. Make sure you discuss any questions you have with your health care provider.   Document Released: 05/31/2011 Document Revised: 12/06/2014 Document Reviewed: 10/17/2013 Elsevier Interactive Patient Education 2016 [employer].  

## 2016-05-13 NOTE — Progress Notes (Signed)
Pre visit review using our clinic review tool, if applicable. No additional management support is needed unless otherwise documented below in the visit note. 

## 2016-05-13 NOTE — Assessment & Plan Note (Signed)
Preventative protocols reviewed and updated unless pt declined. Discussed healthy diet and lifestyle.  

## 2016-05-13 NOTE — Assessment & Plan Note (Addendum)
Overdue for f/u - agrees to reschedule dx mammo at [facility].

## 2016-05-13 NOTE — Addendum Note (Signed)
Addended by: [name] on: 05/13/2016 11:33 AM   Modules accepted: Miquel Dunn

## 2016-05-13 NOTE — Assessment & Plan Note (Signed)
TSH slightly overactive - will increase levothyroxine to 152mcg daily, recheck levels in 2 months.

## 2016-05-14 NOTE — Telephone Encounter (Signed)
8/17 pt aware

## 2016-05-14 NOTE — Telephone Encounter (Signed)
Ordered

## 2016-05-25 ENCOUNTER — Other Ambulatory Visit

## 2016-05-25 DIAGNOSIS — Z1211 Encounter for screening for malignant neoplasm of colon: Secondary | ICD-10-CM

## 2016-05-25 LAB — FECAL OCCULT BLOOD, IMMUNOCHEMICAL: Fecal Occult Bld: NEGATIVE

## 2016-07-15 ENCOUNTER — Ambulatory Visit

## 2016-07-15 ENCOUNTER — Other Ambulatory Visit

## 2016-07-28 ENCOUNTER — Ambulatory Visit
Admission: RE | Admit: 2016-07-28 | Discharge: 2016-07-28 | Disposition: A | Discharge status: Home / Self Care | Source: Ambulatory Visit | Attending: Family Medicine | Admitting: Family Medicine

## 2016-07-28 ENCOUNTER — Other Ambulatory Visit: Payer: Self-pay | Admitting: Family Medicine

## 2016-07-28 DIAGNOSIS — R921 Mammographic calcification found on diagnostic imaging of breast: Secondary | ICD-10-CM

## 2016-07-29 ENCOUNTER — Encounter: Payer: Self-pay | Admitting: *Deleted

## 2016-07-29 LAB — HM MAMMOGRAPHY

## 2016-11-11 ENCOUNTER — Other Ambulatory Visit: Payer: Self-pay

## 2016-11-11 MED ORDER — LEVOTHYROXINE SODIUM 137 MCG PO TABS
137.0000 ug | ORAL_TABLET | Freq: Every day | ORAL | 1 refills | Status: DC
Start: 2016-11-11 — End: 2016-11-24

## 2016-11-11 NOTE — Telephone Encounter (Signed)
Mr Cheryl Brady [facility] signed) left v/m requesting refill levothyroxine to meds by mail [place]. Last refilled # 90 x 1 on 05/13/16. Pt last annual 05/13/16 and pt scheduled for 6 mth f/u 12/13/16. 04/28/16 TSH was 5.18 and was to reck TSH in 2 months; do not see where labs were repeated.Please advise.

## 2016-11-12 ENCOUNTER — Ambulatory Visit: Admitting: Family Medicine

## 2016-11-24 ENCOUNTER — Telehealth: Payer: Self-pay

## 2016-11-24 MED ORDER — LEVOTHYROXINE SODIUM 137 MCG PO TABS: 137.0000 ug | ORAL_TABLET | Freq: Every day | ORAL | 0 refills | Status: DC

## 2016-11-24 NOTE — Telephone Encounter (Signed)
Pt did not receive levothyroxine before leaving for beach; request # [phone] sent to [place]. Done and Mr Anselmo voiced understanding.

## 2016-12-13 ENCOUNTER — Ambulatory Visit (INDEPENDENT_AMBULATORY_CARE_PROVIDER_SITE_OTHER): Payer: Self-pay | Admitting: Family Medicine

## 2016-12-13 ENCOUNTER — Encounter: Payer: Self-pay | Admitting: Family Medicine

## 2016-12-13 ENCOUNTER — Ambulatory Visit: Admitting: Family Medicine

## 2016-12-13 VITALS — BP 124/82 | HR 72 | Temp 98.2°F | Wt 153.2 lb

## 2016-12-13 DIAGNOSIS — R634 Abnormal weight loss: Secondary | ICD-10-CM

## 2016-12-13 DIAGNOSIS — H43391 Other vitreous opacities, right eye: Secondary | ICD-10-CM

## 2016-12-13 DIAGNOSIS — E785 Hyperlipidemia, unspecified: Secondary | ICD-10-CM

## 2016-12-13 DIAGNOSIS — E039 Hypothyroidism, unspecified: Secondary | ICD-10-CM

## 2016-12-13 DIAGNOSIS — Z1159 Encounter for screening for other viral diseases: Secondary | ICD-10-CM

## 2016-12-13 LAB — HEPATIC FUNCTION PANEL
ALBUMIN: 4.4 g/dL (ref 3.5–5.2)
ALT: 9 U/L (ref 0–35)
AST: 13 U/L (ref 0–37)
Alkaline Phosphatase: 71 U/L (ref 39–117)
Bilirubin, Direct: 0.1 mg/dL (ref 0.0–0.3)
Total Bilirubin: 0.7 mg/dL (ref 0.2–1.2)
Total Protein: 7.5 g/dL (ref 6.0–8.3)

## 2016-12-13 LAB — CBC WITH DIFFERENTIAL/PLATELET
Basophils Absolute: 0 10*3/uL (ref 0.0–0.1)
Basophils Relative: 0.5 % (ref 0.0–3.0)
EOS PCT: 2.3 % (ref 0.0–5.0)
Eosinophils Absolute: 0.2 10*3/uL (ref 0.0–0.7)
HCT: 43.7 % (ref 36.0–46.0)
HEMOGLOBIN: 15.1 g/dL — AB (ref 12.0–15.0)
LYMPHS PCT: 31.9 % (ref 12.0–46.0)
Lymphs Abs: 2.5 10*3/uL (ref 0.7–4.0)
MCHC: 34.5 g/dL (ref 30.0–36.0)
MCV: 89 fl (ref 78.0–100.0)
Monocytes Absolute: 0.6 10*3/uL (ref 0.1–1.0)
Monocytes Relative: 8.2 % (ref 3.0–12.0)
Neutro Abs: 4.5 10*3/uL (ref 1.4–7.7)
Neutrophils Relative %: 57.1 % (ref 43.0–77.0)
Platelets: 251 10*3/uL (ref 150.0–400.0)
RBC: 4.91 Mil/uL (ref 3.87–5.11)
RDW: 12.9 % (ref 11.5–15.5)
WBC: 7.9 10*3/uL (ref 4.0–10.5)

## 2016-12-13 LAB — BASIC METABOLIC PANEL
BUN: 10 mg/dL (ref 6–23)
CALCIUM: 10 mg/dL (ref 8.4–10.5)
CO2: 30 meq/L (ref 19–32)
CREATININE: 0.54 mg/dL (ref 0.40–1.20)
Chloride: 105 mEq/L (ref 96–112)
GFR: 124.76 mL/min (ref >60.00)
GLUCOSE: 88 mg/dL (ref 70–99)
Potassium: 4.4 mEq/L (ref 3.5–5.1)
Sodium: 140 mEq/L (ref 135–145)

## 2016-12-13 LAB — TSH: TSH: 0.74 u[IU]/mL (ref 0.35–4.50)

## 2016-12-13 LAB — LIPID PANEL
CHOL/HDL RATIO: 5
Cholesterol: 235 mg/dL — ABNORMAL HIGH (ref 0–200)
HDL: 46.5 mg/dL (ref >39.00)
LDL Cholesterol: 165 mg/dL — ABNORMAL HIGH (ref 0–99)
NONHDL: 188.4
Triglycerides: 118 mg/dL (ref 0.0–149.0)
VLDL: 23.6 mg/dL (ref 0.0–40.0)

## 2016-12-13 LAB — T4, FREE: FREE T4: 1.31 ng/dL (ref 0.60–1.60)

## 2016-12-13 NOTE — Assessment & Plan Note (Signed)
New - will refer to ophtho for evaluation of retinal tear/detachment. Pt endorses fmhx degenerative eye disease but unsure details.

## 2016-12-13 NOTE — Progress Notes (Signed)
BP 124/82   Pulse 72   Temp 98.2 F (36.8 C) (Oral)   Wt 153 lb 4 oz (69.5 kg)   BMI 25.11 kg/m    CC: 6 mo f/u visit Subjective:    Patient ID: Cheryl Brady, female    DOB: [birth year], 56 y.o.   MRN: [id number]  HPI: Cheryl Brady is a 56 y.o. female presenting on 12/13/2016 for Follow-up   Hypothyroidism - compliant with 178mcg levothyroxine daily.   1 mo h/o R eye spot in eye "lint" possible floater. She has used eye wash, and lubricating eye drops without improvement. Has seen eye doctor almost 1 yr ago. Denies inciting trauma/injury, no h/o shingles. No h/o fever blisters or cold sores. Some redness of eye. 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 Some photopsia.   100 lb weight loss over 2 yrs through healthy diet and lifestyle changes.   Relevant past medical, surgical, family and social history reviewed and updated as indicated. Interim medical history since our last visit reviewed. Allergies and medications reviewed and updated. Current Outpatient Prescriptions on File Prior to Visit  Medication Sig  . levothyroxine (SYNTHROID, LEVOTHROID) 137 MCG tablet Take 1 tablet (137 mcg total) by mouth daily.   No current facility-administered medications on file prior to visit.     Review of Systems Per HPI unless specifically indicated in ROS section     Objective:    BP 124/82   Pulse 72   Temp 98.2 F (36.8 C) (Oral)   Wt 153 lb 4 oz (69.5 kg)   BMI 25.11 kg/m   Wt Readings from Last 3 Encounters:  12/13/16 153 lb 4 oz (69.5 kg)  05/13/16 148 lb (67.1 kg)  11/05/15 154 lb 8 oz (70.1 kg)    Physical Exam  Constitutional: She appears well-developed and well-nourished. No distress.  HENT:  Mouth/Throat: Oropharynx is clear and moist. No oropharyngeal exudate.  Eyes: Conjunctivae and EOM are normal. Pupils are equal, round, and reactive to light. Right eye exhibits no discharge. Left eye exhibits no discharge. No scleral icterus.  Early  cataracts No foreign objects appreciated Limited fundoscopic exam   Cardiovascular: Normal rate, regular rhythm, normal heart sounds and intact distal pulses.   No murmur heard. Pulmonary/Chest: Effort normal and breath sounds normal. No respiratory distress. She has no wheezes. She has no rales.  Musculoskeletal: She exhibits no edema.  Skin: Skin is warm and dry. No rash noted.  Psychiatric: She has a normal mood and affect.  Nursing note and vitals reviewed.  Results for orders placed or performed in visit on 07/29/16  HM MAMMOGRAPHY  Result Value Ref Range   HM Mammogram 0-4 Bi-Rad 0-4 Bi-Rad, Self Reported Normal      Assessment & Plan:   Problem List Items Addressed This Visit    Floaters, right - Primary    New - will refer to ophtho for evaluation of retinal tear/detachment. Pt endorses fmhx degenerative eye disease but unsure details.       Hyperlipidemia    Chronic, stable. Update FLP      Relevant Orders   Basic metabolic panel   Lipid panel   Hypothyroid    Update TFTs on higher levothyroxine dose.        Other Visit Diagnoses    Need for hepatitis C screening test       Relevant Orders   Hepatitis C antibody   Loss of weight  Follow up plan: No Follow-up on file.  Cheryl Bush, MD

## 2016-12-13 NOTE — Assessment & Plan Note (Signed)
Update TFTs on higher levothyroxine dose.

## 2016-12-13 NOTE — Assessment & Plan Note (Signed)
Chronic, stable. Update FLP

## 2016-12-13 NOTE — Progress Notes (Signed)
Pre visit review using our clinic review tool, if applicable. No additional management support is needed unless otherwise documented below in the visit note. 

## 2016-12-14 LAB — HEPATITIS C ANTIBODY: HCV AB: NEGATIVE

## 2016-12-17 ENCOUNTER — Encounter: Payer: Self-pay | Admitting: *Deleted

## 2017-05-25 ENCOUNTER — Other Ambulatory Visit: Payer: Self-pay

## 2017-05-25 MED ORDER — LEVOTHYROXINE SODIUM 137 MCG PO TABS: 137.0000 ug | ORAL_TABLET | Freq: Every day | ORAL | 1 refills | Status: DC

## 2017-05-25 NOTE — Telephone Encounter (Signed)
Mr Havel [place] signed left v/m requesting refill levothyroxine 137 mcg to meds by mail [place]. Per DPR left detailed v/m refill done and pt needs to call for appt.

## 2017-09-19 ENCOUNTER — Other Ambulatory Visit: Payer: Self-pay | Admitting: *Deleted

## 2017-09-19 ENCOUNTER — Telehealth: Payer: Self-pay | Admitting: *Deleted

## 2017-09-19 MED ORDER — LEVOTHYROXINE SODIUM 137 MCG PO TABS: 137.0000 ug | ORAL_TABLET | Freq: Every day | ORAL | 1 refills | Status: DC

## 2017-09-19 NOTE — Telephone Encounter (Signed)
Rx refilled per protocol- last appt 11/2016

## 2017-09-19 NOTE — Telephone Encounter (Signed)
Copied from [place] #[id number]. Topic: Quick Communication - See Telephone Encounter >> Sep 19, 2017  8:30 AM Cecelia Byars, NT wrote: CRM for notification. See Telephone encounter for:  rx refill 09/19/17. Pts leaving town in 3 days

## 2017-09-19 NOTE — Progress Notes (Unsigned)
Thyroid medication refilled per protocol- last appointment 11/2016.

## 2017-09-19 NOTE — Telephone Encounter (Signed)
Copied from [place] #[id number]. Topic: Quick Communication - See Telephone Encounter >> Sep 19, 2017  8:30 AM Cecelia Byars, NT wrote: CRM for notification. See Telephone encounter for:  09/19/17.

## 2017-10-12 ENCOUNTER — Encounter: Payer: Self-pay | Admitting: Family Medicine

## 2017-10-12 ENCOUNTER — Ambulatory Visit (INDEPENDENT_AMBULATORY_CARE_PROVIDER_SITE_OTHER): Admitting: Family Medicine

## 2017-10-12 VITALS — BP 120/72 | HR 67 | Temp 98.4°F | Ht 65.0 in | Wt 156.0 lb

## 2017-10-12 DIAGNOSIS — Z0000 Encounter for general adult medical examination without abnormal findings: Secondary | ICD-10-CM

## 2017-10-12 DIAGNOSIS — R921 Mammographic calcification found on diagnostic imaging of breast: Secondary | ICD-10-CM

## 2017-10-12 DIAGNOSIS — E039 Hypothyroidism, unspecified: Secondary | ICD-10-CM

## 2017-10-12 DIAGNOSIS — E785 Hyperlipidemia, unspecified: Secondary | ICD-10-CM | POA: Diagnosis not present

## 2017-10-12 DIAGNOSIS — Z1211 Encounter for screening for malignant neoplasm of colon: Secondary | ICD-10-CM | POA: Diagnosis not present

## 2017-10-12 MED ORDER — LEVOTHYROXINE SODIUM 137 MCG PO TABS: 137.0000 ug | ORAL_TABLET | Freq: Every day | ORAL | 3 refills | Status: DC

## 2017-10-12 NOTE — Assessment & Plan Note (Signed)
Chronic, stable. Update TFTs.

## 2017-10-12 NOTE — Assessment & Plan Note (Signed)
Preventative protocols reviewed and updated unless pt declined. Discussed healthy diet and lifestyle.  

## 2017-10-12 NOTE — Progress Notes (Signed)
BP 120/72 (BP Location: Left Arm, Patient Position: Sitting, Cuff Size: Normal)   Pulse 67   Temp 98.4 F (36.9 C) (Oral)   Ht _0  (1.651 m)   Wt 156 lb (70.8 kg)   SpO2 97%   BMI 25.96 kg/m    CC: CPE Subjective:    Patient ID: Cheryl Brady, female    DOB: Jun 22, 1961, 56 y.o.   MRN: [id number]  HPI: Cheryl Brady is a 56 y.o. female presenting on 10/12/2017 for Annual Exam   They are having problems with [place] billing protocol - started with mammogram several years back - not getting bills sent to their house then getting turned over to collections agent affecting credit and ability to buy a house.  Preventative: Well woman - Dr Hulan Fray 06/2015, normal exam/pap. Due for f/u.  Mammogram - 06/2016 with bilateral breast calcifications. Will call to schedule f/u.  Colon cancer - discussed. Requests stool kit.  Declines flu shot. Declines pneumovax. Tdap 2013. Sunscreen use discussed. No changing moles on skin.  Seatbelt use discussed.  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 Interim medical history since our last visit reviewed. Allergies and medications reviewed and updated. Outpatient Medications Prior to Visit  Medication Sig Dispense Refill  . levothyroxine (SYNTHROID, LEVOTHROID) 137 MCG tablet Take 1 tablet (137 mcg total) by mouth daily. 90 tablet 1   No facility-administered medications prior to visit.      Per HPI unless specifically indicated in ROS section below Review of Systems  Constitutional: Negative for activity change, appetite change, chills, fatigue, fever and unexpected weight change.  HENT: Negative for hearing loss.   Eyes: Negative for visual disturbance.  Respiratory:  Negative for cough, chest tightness, shortness of breath and wheezing.   Cardiovascular: Negative for chest pain, palpitations and leg swelling.  Gastrointestinal: Negative for abdominal distention, abdominal pain, blood in stool, constipation, diarrhea, nausea and vomiting.  Genitourinary: Negative for difficulty urinating and hematuria.  Musculoskeletal: Negative for arthralgias, myalgias and neck pain.  Skin: Negative for rash.  Neurological: Negative for dizziness, seizures, syncope and headaches.  Hematological: Negative for adenopathy. Does not bruise/bleed easily.  Psychiatric/Behavioral: Negative for dysphoric mood. The patient is not nervous/anxious.        Objective:    BP 120/72 (BP Location: Left Arm, Patient Position: Sitting, Cuff Size: Normal)   Pulse 67   Temp 98.4 F (36.9 C) (Oral)   Ht _1  (1.651 m)   Wt 156 lb (70.8 kg)   SpO2 97%   BMI 25.96 kg/m   Wt Readings from Last 3 Encounters:  10/12/17 156 lb (70.8 kg)  12/13/16 153 lb 4 oz (69.5 kg)  05/13/16 148 lb (67.1 kg)    Physical Exam  Constitutional: She is oriented to person, place, and time. She appears well-developed and well-nourished. No distress.  HENT:  Head: Normocephalic and atraumatic.  Right Ear: Hearing, tympanic membrane, external ear and ear canal normal.  Left Ear: Hearing, tympanic membrane, external ear and ear canal normal.  Nose: Nose normal.  Mouth/Throat: Uvula is midline, oropharynx is clear and moist and mucous membranes are normal. No oropharyngeal exudate, posterior oropharyngeal edema or posterior oropharyngeal erythema.  Eyes: Conjunctivae and EOM are normal. Pupils are equal, round, and reactive to  light. No scleral icterus.  Neck: Normal range of motion. Neck supple. No thyromegaly present.  Cardiovascular: Normal rate, regular rhythm, normal heart sounds and intact distal pulses.  No murmur heard. Pulses:      Radial pulses are 2+ on the right side, and 2+ on the left  side.  Pulmonary/Chest: Effort normal and breath sounds normal. No respiratory distress. She has no wheezes. She has no rales.  Abdominal: Soft. Bowel sounds are normal. She exhibits no distension and no mass. There is no tenderness. There is no rebound and no guarding.  Musculoskeletal: Normal range of motion. She exhibits no edema.  Lymphadenopathy:    She has no cervical adenopathy.  Neurological: She is alert and oriented to person, place, and time.  CN grossly intact, station and gait intact  Skin: Skin is warm and dry. No rash noted.  Psychiatric: She has a normal mood and affect. Her behavior is normal. Judgment and thought content normal.  Nursing note and vitals reviewed.  Results for orders placed or performed in visit on 12/13/16  Hepatitis C antibody  Result Value Ref Range   HCV Ab NEGATIVE NEGATIVE  TSH  Result Value Ref Range   TSH 0.74 0.35 - 4.50 uIU/mL  T4, free  Result Value Ref Range   Free T4 1.31 0.60 - 1.60 ng/dL  Hepatic function panel  Result Value Ref Range   Total Bilirubin 0.7 0.2 - 1.2 mg/dL   Bilirubin, Direct 0.1 0.0 - 0.3 mg/dL   Alkaline Phosphatase 71 39 - 117 U/L   AST 13 0 - 37 U/L   ALT 9 0 - 35 U/L   Total Protein 7.5 6.0 - 8.3 g/dL   Albumin 4.4 3.5 - 5.2 g/dL  CBC with Differential/Platelet  Result Value Ref Range   WBC 7.9 4.0 - 10.5 K/uL   RBC 4.91 3.87 - 5.11 Mil/uL   Hemoglobin 15.1 (H) 12.0 - 15.0 g/dL   HCT 43.7 36.0 - 46.0 %   MCV 89.0 78.0 - 100.0 fl   MCHC 34.5 30.0 - 36.0 g/dL   RDW 12.9 11.5 - 15.5 %   Platelets 251.0 150.0 - 400.0 K/uL   Neutrophils Relative % 57.1 43.0 - 77.0 %   Lymphocytes Relative 31.9 12.0 - 46.0 %   Monocytes Relative 8.2 3.0 - 12.0 %   Eosinophils Relative 2.3 0.0 - 5.0 %   Basophils Relative 0.5 0.0 - 3.0 %   Neutro Abs 4.5 1.4 - 7.7 K/uL   Lymphs Abs 2.5 0.7 - 4.0 K/uL   Monocytes Absolute 0.6 0.1 - 1.0 K/uL   Eosinophils Absolute 0.2 0.0 - 0.7 K/uL   Basophils Absolute 0.0 0.0 - 0.1 K/uL    Basic metabolic panel  Result Value Ref Range   Sodium 140 135 - 145 mEq/L   Potassium 4.4 3.5 - 5.1 mEq/L   Chloride 105 96 - 112 mEq/L   CO2 30 19 - 32 mEq/L   Glucose, Bld 88 70 - 99 mg/dL   BUN 10 6 - 23 mg/dL   Creatinine, Ser 0.54 0.40 - 1.20 mg/dL   Calcium 10.0 8.4 - 10.5 mg/dL   GFR 124.76 >60.00 mL/min  Lipid panel  Result Value Ref Range   Cholesterol 235 (H) 0 - 200 mg/dL   Triglycerides 118.0 0.0 - 149.0 mg/dL   HDL 46.50 >39.00 mg/dL   VLDL 23.6 0.0 - 40.0 mg/dL   LDL Cholesterol 165 (H) 0 - 99 mg/dL   Total CHOL/HDL Ratio  5    NonHDL 188.40       Assessment & Plan:   Problem List Items Addressed This Visit    Breast calcifications on mammogram    Encouraged they call to schedule f/u mammogram.       Healthcare maintenance - Primary    Preventative protocols reviewed and updated unless pt declined. Discussed healthy diet and lifestyle.       Hyperlipidemia    Chronic off meds. Update FLP when returns fasting.  The 10-year ASCVD risk score Mikey Bussing DC Brooke Bonito., et al., 2013) is: 2.4%   Values used to calculate the score:     Age: 41 years     Sex: Female     Is Non-Hispanic African American: No     Diabetic: No     Tobacco smoker: No     Systolic Blood Pressure: 208 mmHg     Is BP treated: No     HDL Cholesterol: 46.5 mg/dL     Total Cholesterol: 235 mg/dL       Hypothyroid    Chronic, stable. Update TFTs.       Relevant Medications   levothyroxine (SYNTHROID, LEVOTHROID) 137 MCG tablet    Other Visit Diagnoses    Special screening for malignant neoplasms, colon       Relevant Orders   Fecal occult blood, imunochemical       Follow up plan: Return in about 1 year (around 10/12/2018) for annual exam, prior fasting for blood work.  [name], MD

## 2017-10-12 NOTE — Assessment & Plan Note (Signed)
Chronic off meds. Update FLP when returns fasting.  The 10-year ASCVD risk score Mikey Bussing DC Brooke Bonito., et al., 2013) is: 2.4%   Values used to calculate the score:     Age: 56 years     Sex: Female     Is Non-Hispanic African American: No     Diabetic: No     Tobacco smoker: No     Systolic Blood Pressure: 096 mmHg     Is BP treated: No     HDL Cholesterol: 46.5 mg/dL     Total Cholesterol: 235 mg/dL

## 2017-10-12 NOTE — Patient Instructions (Addendum)
Please talk to Junie Panning our Environmental education officer about [facility] billing issues you've had the past year. ?dedicated billing specialist to assist you Return at your convenience for fasting labs (lab visit only).  Call to schedule mammogram.  Pass by lab to pick up stool kit. Return in 1 year for next physical.  Health Maintenance, Female Adopting a healthy lifestyle and getting preventive care can go a long way to promote health and wellness. Talk with your health care provider about what schedule of regular examinations is right for you. This is a good chance for you to check in with your provider about disease prevention and staying healthy. In between checkups, there are plenty of things you can do on your own. Experts have done a lot of research about which lifestyle changes and preventive measures are most likely to keep you healthy. Ask your health care provider for more information. Weight and diet Eat a healthy diet  Be sure to include plenty of vegetables, fruits, low-fat dairy products, and lean protein.  Do not eat a lot of foods high in solid fats, added sugars, or salt.  Get regular exercise. This is one of the most important things you can do for your health. ? Most adults should exercise for at least 150 minutes each week. The exercise should increase your heart rate and make you sweat (moderate-intensity exercise). ? Most adults should also do strengthening exercises at least twice a week. This is in addition to the moderate-intensity exercise.  Maintain a healthy weight  Body mass index (BMI) is a measurement that can be used to identify possible weight problems. It estimates body fat based on height and weight. Your health care provider can help determine your BMI and help you achieve or maintain a healthy weight.  For females 22 years of age and older: ? A BMI below 18.5 is considered underweight. ? A BMI of 18.5 to 24.9 is normal. ? A BMI of 25 to 29.9 is considered overweight. ? A  BMI of 30 and above is considered obese.  Watch levels of cholesterol and blood lipids  You should start having your blood tested for lipids and cholesterol at 55 years of age, then have this test every 5 years.  You may need to have your cholesterol levels checked more often if: ? Your lipid or cholesterol levels are high. ? You are older than 55 years of age. ? You are at high risk for heart disease.  Cancer screening Lung Cancer  Lung cancer screening is recommended for adults 71-70 years old who are at high risk for lung cancer because of a history of smoking.  A yearly low-dose CT scan of the lungs is recommended for people who: ? Currently smoke. ? Have quit within the past 15 years. ? Have at least a 30-pack-year history of smoking. A pack year is smoking an average of one pack of cigarettes a day for 1 year.  Yearly screening should continue until it has been 15 years since you quit.  Yearly screening should stop if you develop a health problem that would prevent you from having lung cancer treatment.  Breast Cancer  Practice breast self-awareness. This means understanding how your breasts normally appear and feel.  It also means doing regular breast self-exams. Let your health care provider know about any changes, no matter how small.  If you are in your 20s or 30s, you should have a clinical breast exam (CBE) by a health care provider every 1-3 years  as part of a regular health exam.  If you are 54 or older, have a CBE every year. Also consider having a breast X-ray (mammogram) every year.  If you have a family history of breast cancer, talk to your health care provider about genetic screening.  If you are at high risk for breast cancer, talk to your health care provider about having an MRI and a mammogram every year.  Breast cancer gene (BRCA) assessment is recommended for women who have family members with BRCA-related cancers. BRCA-related cancers  include: ? Breast. ? Ovarian. ? Tubal. ? Peritoneal cancers.  Results of the assessment will determine the need for genetic counseling and BRCA1 and BRCA2 testing.  Cervical Cancer Your health care provider may recommend that you be screened regularly for cancer of the pelvic organs (ovaries, uterus, and vagina). This screening involves a pelvic examination, including checking for microscopic changes to the surface of your cervix (Pap test). You may be encouraged to have this screening done every 3 years, beginning at age 69.  For women ages 40-65, health care providers may recommend pelvic exams and Pap testing every 3 years, or they may recommend the Pap and pelvic exam, combined with testing for human papilloma virus (HPV), every 5 years. Some types of HPV increase your risk of cervical cancer. Testing for HPV may also be done on women of any age with unclear Pap test results.  Other health care providers may not recommend any screening for nonpregnant women who are considered low risk for pelvic cancer and who do not have symptoms. Ask your health care provider if a screening pelvic exam is right for you.  If you have had past treatment for cervical cancer or a condition that could lead to cancer, you need Pap tests and screening for cancer for at least 20 years after your treatment. If Pap tests have been discontinued, your risk factors (such as having a new sexual partner) need to be reassessed to determine if screening should resume. Some women have medical problems that increase the chance of getting cervical cancer. In these cases, your health care provider may recommend more frequent screening and Pap tests.  Colorectal Cancer  This type of cancer can be detected and often prevented.  Routine colorectal cancer screening usually begins at 55 years of age and continues through 55 years of age.  Your health care provider may recommend screening at an earlier age if you have risk factors  for colon cancer.  Your health care provider may also recommend using home test kits to check for hidden blood in the stool.  A small camera at the end of a tube can be used to examine your colon directly (sigmoidoscopy or colonoscopy). This is done to check for the earliest forms of colorectal cancer.  Routine screening usually begins at age 60.  Direct examination of the colon should be repeated every 5-10 years through 55 years of age. However, you may need to be screened more often if early forms of precancerous polyps or small growths are found.  Skin Cancer  Check your skin from head to toe regularly.  Tell your health care provider about any new moles or changes in moles, especially if there is a change in a mole's shape or color.  Also tell your health care provider if you have a mole that is larger than the size of a pencil eraser.  Always use sunscreen. Apply sunscreen liberally and repeatedly throughout the day.  Protect yourself by  wearing long sleeves, pants, a wide-brimmed hat, and sunglasses whenever you are outside.  Heart disease, diabetes, and high blood pressure  High blood pressure causes heart disease and increases the risk of stroke. High blood pressure is more likely to develop in: ? People who have blood pressure in the high end of the normal range (130-139/85-89 mm Hg). ? People who are overweight or obese. ? People who are African American.  If you are 48-51 years of age, have your blood pressure checked every 3-5 years. If you are 17 years of age or older, have your blood pressure checked every year. You should have your blood pressure measured twice-once when you are at a hospital or clinic, and once when you are not at a hospital or clinic. Record the average of the two measurements. To check your blood pressure when you are not at a hospital or clinic, you can use: ? An automated blood pressure machine at a pharmacy. ? A home blood pressure monitor.  If  you are between 67 years and 58 years old, ask your health care provider if you should take aspirin to prevent strokes.  Have regular diabetes screenings. This involves taking a blood sample to check your fasting blood sugar level. ? If you are at a normal weight and have a low risk for diabetes, have this test once every three years after 55 years of age. ? If you are overweight and have a high risk for diabetes, consider being tested at a younger age or more often. Preventing infection Hepatitis B  If you have a higher risk for hepatitis B, you should be screened for this virus. You are considered at high risk for hepatitis B if: ? You were born in a country where hepatitis B is common. Ask your health care provider which countries are considered high risk. ? Your parents were born in a high-risk country, and you have not been immunized against hepatitis B (hepatitis B vaccine). ? You have HIV or AIDS. ? You use needles to inject street drugs. ? You live with someone who has hepatitis B. ? You have had sex with someone who has hepatitis B. ? You get hemodialysis treatment. ? You take certain medicines for conditions, including cancer, organ transplantation, and autoimmune conditions.  Hepatitis C  Blood testing is recommended for: ? Everyone born from [birth year] through 1964. ? Anyone with known risk factors for hepatitis C.  Sexually transmitted infections (STIs)  You should be screened for sexually transmitted infections (STIs) including gonorrhea and chlamydia if: ? You are sexually active and are younger than 55 years of age. ? You are older than 55 years of age and your health care provider tells you that you are at risk for this type of infection. ? Your sexual activity has changed since you were last screened and you are at an increased risk for chlamydia or gonorrhea. Ask your health care provider if you are at risk.  If you do not have HIV, but are at risk, it may be recommended  that you take a prescription medicine daily to prevent HIV infection. This is called pre-exposure prophylaxis (PrEP). You are considered at risk if: ? You are sexually active and do not regularly use condoms or know the HIV status of your partner(s). ? You take drugs by injection. ? You are sexually active with a partner who has HIV.  Talk with your health care provider about whether you are at high risk of being infected with  HIV. If you choose to begin PrEP, you should first be tested for HIV. You should then be tested every 3 months for as long as you are taking PrEP. Pregnancy  If you are premenopausal and you may become pregnant, ask your health care provider about preconception counseling.  If you may become pregnant, take 400 to 800 micrograms (mcg) of folic acid every day.  If you want to prevent pregnancy, talk to your health care provider about birth control (contraception). Osteoporosis and menopause  Osteoporosis is a disease in which the bones lose minerals and strength with aging. This can result in serious bone fractures. Your risk for osteoporosis can be identified using a bone density scan.  If you are 21 years of age or older, or if you are at risk for osteoporosis and fractures, ask your health care provider if you should be screened.  Ask your health care provider whether you should take a calcium or vitamin D supplement to lower your risk for osteoporosis.  Menopause may have certain physical symptoms and risks.  Hormone replacement therapy may reduce some of these symptoms and risks. Talk to your health care provider about whether hormone replacement therapy is right for you. Follow these instructions at home:  Schedule regular health, dental, and eye exams.  Stay current with your immunizations.  Do not use any tobacco products including cigarettes, chewing tobacco, or electronic cigarettes.  If you are pregnant, do not drink alcohol.  If you are  breastfeeding, limit how much and how often you drink alcohol.  Limit alcohol intake to no more than 1 drink per day for nonpregnant women. One drink equals 12 ounces of beer, 5 ounces of wine, or 1 ounces of hard liquor.  Do not use street drugs.  Do not share needles.  Ask your health care provider for help if you need support or information about quitting drugs.  Tell your health care provider if you often feel depressed.  Tell your health care provider if you have ever been abused or do not feel safe at home. This information is not intended to replace advice given to you by your health care provider. Make sure you discuss any questions you have with your health care provider. Document Released: 05/31/2011 Document Revised: 04/22/2016 Document Reviewed: 08/19/2015 Elsevier Interactive Patient Education  [employer].

## 2017-10-12 NOTE — Assessment & Plan Note (Signed)
Encouraged they call to schedule f/u mammogram.

## 2017-10-25 ENCOUNTER — Other Ambulatory Visit: Payer: Self-pay | Admitting: Family Medicine

## 2017-10-25 ENCOUNTER — Other Ambulatory Visit (INDEPENDENT_AMBULATORY_CARE_PROVIDER_SITE_OTHER)

## 2017-10-25 DIAGNOSIS — E039 Hypothyroidism, unspecified: Secondary | ICD-10-CM

## 2017-10-25 DIAGNOSIS — E785 Hyperlipidemia, unspecified: Secondary | ICD-10-CM

## 2017-10-25 LAB — COMPREHENSIVE METABOLIC PANEL
ALK PHOS: 84 U/L (ref 39–117)
ALT: 9 U/L (ref 0–35)
AST: 14 U/L (ref 0–37)
Albumin: 4.5 g/dL (ref 3.5–5.2)
BUN: 11 mg/dL (ref 6–23)
CO2: 29 meq/L (ref 19–32)
Calcium: 10.2 mg/dL (ref 8.4–10.5)
Chloride: 105 mEq/L (ref 96–112)
Creatinine, Ser: 0.5 mg/dL (ref 0.40–1.20)
GFR: 135.91 mL/min (ref >60.00)
GLUCOSE: 96 mg/dL (ref 70–99)
Potassium: 4.3 mEq/L (ref 3.5–5.1)
SODIUM: 145 meq/L (ref 135–145)
TOTAL PROTEIN: 7.5 g/dL (ref 6.0–8.3)
Total Bilirubin: 0.7 mg/dL (ref 0.2–1.2)

## 2017-10-25 LAB — LIPID PANEL
CHOL/HDL RATIO: 5
Cholesterol: 253 mg/dL — ABNORMAL HIGH (ref 0–200)
HDL: 46.2 mg/dL (ref >39.00)
LDL Cholesterol: 186 mg/dL — ABNORMAL HIGH (ref 0–99)
NONHDL: 206.69
Triglycerides: 104 mg/dL (ref 0.0–149.0)
VLDL: 20.8 mg/dL (ref 0.0–40.0)

## 2017-10-25 LAB — TSH: TSH: 0.14 u[IU]/mL — AB (ref 0.35–4.50)

## 2017-10-25 LAB — T4, FREE: FREE T4: 1.15 ng/dL (ref 0.60–1.60)

## 2017-10-25 MED ORDER — LEVOTHYROXINE SODIUM 125 MCG PO TABS: 125.0000 ug | ORAL_TABLET | Freq: Every day | ORAL | 3 refills | Status: DC

## 2017-10-26 MED ORDER — LEVOTHYROXINE SODIUM 125 MCG PO TABS: 125.0000 ug | ORAL_TABLET | Freq: Every day | ORAL | 3 refills | Status: DC

## 2017-10-26 NOTE — Addendum Note (Signed)
Addended by: Pilar Grammes on: 10/26/2017 08:58 AM   Modules accepted: Orders

## 2018-10-04 ENCOUNTER — Other Ambulatory Visit: Payer: Self-pay | Admitting: Family Medicine

## 2018-10-04 DIAGNOSIS — E039 Hypothyroidism, unspecified: Secondary | ICD-10-CM

## 2018-10-04 DIAGNOSIS — E785 Hyperlipidemia, unspecified: Secondary | ICD-10-CM

## 2018-10-04 DIAGNOSIS — R7989 Other specified abnormal findings of blood chemistry: Secondary | ICD-10-CM

## 2018-10-06 ENCOUNTER — Other Ambulatory Visit

## 2018-10-13 ENCOUNTER — Encounter: Admitting: Family Medicine

## 2018-12-07 ENCOUNTER — Other Ambulatory Visit (INDEPENDENT_AMBULATORY_CARE_PROVIDER_SITE_OTHER)

## 2018-12-07 DIAGNOSIS — E785 Hyperlipidemia, unspecified: Secondary | ICD-10-CM

## 2018-12-07 DIAGNOSIS — R7989 Other specified abnormal findings of blood chemistry: Secondary | ICD-10-CM

## 2018-12-07 DIAGNOSIS — E039 Hypothyroidism, unspecified: Secondary | ICD-10-CM | POA: Diagnosis not present

## 2018-12-07 LAB — COMPREHENSIVE METABOLIC PANEL
ALK PHOS: 68 U/L (ref 39–117)
ALT: 10 U/L (ref 0–35)
AST: 14 U/L (ref 0–37)
Albumin: 4.4 g/dL (ref 3.5–5.2)
BILIRUBIN TOTAL: 0.6 mg/dL (ref 0.2–1.2)
BUN: 8 mg/dL (ref 6–23)
CO2: 29 mEq/L (ref 19–32)
Calcium: 9.9 mg/dL (ref 8.4–10.5)
Chloride: 105 mEq/L (ref 96–112)
Creatinine, Ser: 0.56 mg/dL (ref 0.40–1.20)
GFR: 118.77 mL/min (ref >60.00)
GLUCOSE: 84 mg/dL (ref 70–99)
Potassium: 4.2 mEq/L (ref 3.5–5.1)
SODIUM: 141 meq/L (ref 135–145)
TOTAL PROTEIN: 7.3 g/dL (ref 6.0–8.3)

## 2018-12-07 LAB — IBC PANEL
Iron: 83 ug/dL (ref 42–145)
Saturation Ratios: 29.2 % (ref 20.0–50.0)
Transferrin: 203 mg/dL — ABNORMAL LOW (ref 212.0–360.0)

## 2018-12-07 LAB — CBC WITH DIFFERENTIAL/PLATELET
Basophils Absolute: 0 10*3/uL (ref 0.0–0.1)
Basophils Relative: 0.6 % (ref 0.0–3.0)
Eosinophils Absolute: 0.2 10*3/uL (ref 0.0–0.7)
Eosinophils Relative: 3.3 % (ref 0.0–5.0)
HEMATOCRIT: 41.9 % (ref 36.0–46.0)
Hemoglobin: 14.4 g/dL (ref 12.0–15.0)
LYMPHS ABS: 2.5 10*3/uL (ref 0.7–4.0)
LYMPHS PCT: 34.3 % (ref 12.0–46.0)
MCHC: 34.4 g/dL (ref 30.0–36.0)
MCV: 89.5 fl (ref 78.0–100.0)
MONOS PCT: 7.8 % (ref 3.0–12.0)
Monocytes Absolute: 0.6 10*3/uL (ref 0.1–1.0)
NEUTROS ABS: 4 10*3/uL (ref 1.4–7.7)
Neutrophils Relative %: 54 % (ref 43.0–77.0)
PLATELETS: 274 10*3/uL (ref 150.0–400.0)
RBC: 4.67 Mil/uL (ref 3.87–5.11)
RDW: 13.5 % (ref 11.5–15.5)
WBC: 7.4 10*3/uL (ref 4.0–10.5)

## 2018-12-07 LAB — LIPID PANEL
Cholesterol: 234 mg/dL — ABNORMAL HIGH (ref 0–200)
HDL: 47.6 mg/dL (ref >39.00)
LDL Cholesterol: 162 mg/dL — ABNORMAL HIGH (ref 0–99)
NONHDL: 186.03
Total CHOL/HDL Ratio: 5
Triglycerides: 119 mg/dL (ref 0.0–149.0)
VLDL: 23.8 mg/dL (ref 0.0–40.0)

## 2018-12-07 LAB — FERRITIN: Ferritin: 250.3 ng/mL (ref 10.0–291.0)

## 2018-12-07 LAB — T4, FREE: Free T4: 1.09 ng/dL (ref 0.60–1.60)

## 2018-12-07 LAB — SEDIMENTATION RATE: Sed Rate: 6 mm/hr (ref 0–30)

## 2018-12-07 LAB — TSH: TSH: 1.12 u[IU]/mL (ref 0.35–4.50)

## 2018-12-12 ENCOUNTER — Encounter: Payer: Self-pay | Admitting: Family Medicine

## 2018-12-12 NOTE — Progress Notes (Signed)
BP 126/80 (BP Location: Right Arm, Patient Position: Sitting, Cuff Size: Normal)   Pulse 63   Temp 98.4 F (36.9 C) (Oral)   Ht 5' 4.5" (1.638 m)   Wt 154 lb 4 oz (70 kg)   SpO2 97%   BMI 26.07 kg/m    CC: CPE Subjective:    Patient ID: Cheryl Brady, female    DOB: September 18, 1961, 58 y.o.   MRN: [id number]  HPI: Cheryl Brady is a 58 y.o. female presenting on 12/13/2018 for Annual Exam (Pt is accompanied by her husband, )   6 grandchildren, several have recently moved closer.  Noticing increasing fatigue over last few weeks.   Red spot on forehead present for last 3-4 yrs now another spot noted behind ear - lotrimin and cortisone-10 didn't help. Very itchy  Preventative: Well woman - Dr Hulan Fray 06/2015, normal exam/pap. Due for f/u.  Mammogram - 06/2016 with bilateral breast calcifications. Will call to schedule f/u.  Colon cancer - discussed. Requests stool kit.  Declines flu shot. Declines pneumovax. Tdap 2013. shingrix - declines Sunscreen use discussed. No changing moles on skin.  Seatbelt use discussed.  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 Interim medical history since our last visit reviewed. Allergies and medications reviewed and updated. Outpatient Medications Prior to Visit  Medication Sig Dispense Refill  . levothyroxine (SYNTHROID, LEVOTHROID) 125 MCG tablet Take 1 tablet (125 mcg total) by mouth daily. 90 tablet 3   No facility-administered medications prior to visit.      Per HPI unless specifically indicated in ROS section below Review of Systems  Constitutional: Negative for activity change, appetite change, chills, fatigue, fever and  unexpected weight change.  HENT: Negative for hearing loss.   Eyes: Negative for visual disturbance.  Respiratory: Negative for cough, chest tightness, shortness of breath and wheezing.   Cardiovascular: Negative for chest pain, palpitations and leg swelling.  Gastrointestinal: Negative for abdominal distention, abdominal pain, blood in stool, constipation, diarrhea, nausea and vomiting.  Genitourinary: Negative for difficulty urinating and hematuria.  Musculoskeletal: Negative for arthralgias, myalgias and neck pain.  Skin: Negative for rash.  Neurological: Negative for dizziness, seizures, syncope and headaches.  Hematological: Negative for adenopathy. Does not bruise/bleed easily.  Psychiatric/Behavioral: Negative for dysphoric mood. The patient is not nervous/anxious.    Objective:    BP 126/80 (BP Location: Right Arm, Patient Position: Sitting, Cuff Size: Normal)   Pulse 63   Temp 98.4 F (36.9 C) (Oral)   Ht 5' 4.5" (1.638 m)   Wt 154 lb 4 oz (70 kg)   SpO2 97%   BMI 26.07 kg/m   Wt Readings from Last 3 Encounters:  12/13/18 154 lb 4 oz (70 kg)  10/12/17 156 lb (70.8 kg)  12/13/16 153 lb 4 oz (69.5 kg)    Physical Exam Vitals signs and nursing note reviewed.  Constitutional:      General: She is not in acute distress.    Appearance: She is well-developed.  HENT:     Head: Normocephalic and atraumatic.     Right Ear: Hearing, tympanic membrane, ear canal and external ear normal.     Left Ear: Hearing, tympanic membrane, ear canal and external ear  normal.     Nose: Nose normal.     Mouth/Throat:     Pharynx: Uvula midline. No oropharyngeal exudate or posterior oropharyngeal erythema.  Eyes:     General: No scleral icterus.    Conjunctiva/sclera: Conjunctivae normal.     Pupils: Pupils are equal, round, and reactive to light.  Neck:     Musculoskeletal: Normal range of motion and neck supple.  Cardiovascular:     Rate and Rhythm: Normal rate and regular rhythm.      Pulses:          Radial pulses are 2+ on the right side and 2+ on the left side.     Heart sounds: Normal heart sounds. No murmur.  Pulmonary:     Effort: Pulmonary effort is normal. No respiratory distress.     Breath sounds: Normal breath sounds. No wheezing or rales.  Abdominal:     General: Bowel sounds are normal. There is no distension.     Palpations: Abdomen is soft. There is no mass.     Tenderness: There is no abdominal tenderness. There is no guarding or rebound.  Musculoskeletal: Normal range of motion.  Lymphadenopathy:     Cervical: No cervical adenopathy.  Skin:    General: Skin is warm and dry.     Findings: Erythema and rash present.     Comments: Rough erythematous patch above R eyebrow, second patch behind R ear, pruritic  Neurological:     Mental Status: She is alert and oriented to person, place, and time.     Comments: CN grossly intact, station and gait intact  Psychiatric:        Behavior: Behavior normal.        Thought Content: Thought content normal.        Judgment: Judgment normal.       Results for orders placed or performed in visit on 12/07/18  Sedimentation rate  Result Value Ref Range   Sed Rate 6 0 - 30 mm/hr  CBC with Differential/Platelet  Result Value Ref Range   WBC 7.4 4.0 - 10.5 K/uL   RBC 4.67 3.87 - 5.11 Mil/uL   Hemoglobin 14.4 12.0 - 15.0 g/dL   HCT 41.9 36.0 - 46.0 %   MCV 89.5 78.0 - 100.0 fl   MCHC 34.4 30.0 - 36.0 g/dL   RDW 13.5 11.5 - 15.5 %   Platelets 274.0 150.0 - 400.0 K/uL   Neutrophils Relative % 54.0 43.0 - 77.0 %   Lymphocytes Relative 34.3 12.0 - 46.0 %   Monocytes Relative 7.8 3.0 - 12.0 %   Eosinophils Relative 3.3 0.0 - 5.0 %   Basophils Relative 0.6 0.0 - 3.0 %   Neutro Abs 4.0 1.4 - 7.7 K/uL   Lymphs Abs 2.5 0.7 - 4.0 K/uL   Monocytes Absolute 0.6 0.1 - 1.0 K/uL   Eosinophils Absolute 0.2 0.0 - 0.7 K/uL   Basophils Absolute 0.0 0.0 - 0.1 K/uL  Ferritin  Result Value Ref Range   Ferritin 250.3 10.0 -  291.0 ng/mL  IBC panel  Result Value Ref Range   Iron 83 42 - 145 ug/dL   Transferrin 203.0 (L) 212.0 - 360.0 mg/dL   Saturation Ratios 29.2 20.0 - 50.0 %  T4, free  Result Value Ref Range   Free T4 1.09 0.60 - 1.60 ng/dL  TSH  Result Value Ref Range   TSH 1.12 0.35 - 4.50 uIU/mL  Comprehensive metabolic panel  Result Value Ref Range  Sodium 141 135 - 145 mEq/L   Potassium 4.2 3.5 - 5.1 mEq/L   Chloride 105 96 - 112 mEq/L   CO2 29 19 - 32 mEq/L   Glucose, Bld 84 70 - 99 mg/dL   BUN 8 6 - 23 mg/dL   Creatinine, Ser 0.56 0.40 - 1.20 mg/dL   Total Bilirubin 0.6 0.2 - 1.2 mg/dL   Alkaline Phosphatase 68 39 - 117 U/L   AST 14 0 - 37 U/L   ALT 10 0 - 35 U/L   Total Protein 7.3 6.0 - 8.3 g/dL   Albumin 4.4 3.5 - 5.2 g/dL   Calcium 9.9 8.4 - 10.5 mg/dL   GFR 118.77 >60.00 mL/min  Lipid panel  Result Value Ref Range   Cholesterol 234 (H) 0 - 200 mg/dL   Triglycerides 119.0 0.0 - 149.0 mg/dL   HDL 47.60 >39.00 mg/dL   VLDL 23.8 0.0 - 40.0 mg/dL   LDL Cholesterol 162 (H) 0 - 99 mg/dL   Total CHOL/HDL Ratio 5    NonHDL 186.03    Assessment & Plan:  Encouraged to schedule f/u well woman exam.  Problem List Items Addressed This Visit    Rash of face    Present for years, not improving with emollient cream, not improved with cortisone-10 or lotrimin creams. Will refer to derm for further eval.       Relevant Orders   Ambulatory referral to Dermatology   Hypothyroid    Chronic, stable. TFTs stable.       Relevant Medications   levothyroxine (SYNTHROID, LEVOTHROID) 125 MCG tablet   Hyperlipidemia    Chronic off meds. Anticipate hereditary hyperlipidemia - as she maintains a healthy diet.  The 10-year ASCVD risk score Mikey Bussing DC Brooke Bonito., et al., 2013) is: 2.8%   Values used to calculate the score:     Age: 37 years     Sex: Female     Is Non-Hispanic African American: No     Diabetic: No     Tobacco smoker: No     Systolic Blood Pressure: 782 mmHg     Is BP treated: No      HDL Cholesterol: 47.6 mg/dL     Total Cholesterol: 234 mg/dL       High serum ferritin    Levels have normalized on repeat testing.       Healthcare maintenance - Primary    Preventative protocols reviewed and updated unless pt declined. Discussed healthy diet and lifestyle.       Breast calcifications on mammogram    Reviewed with patient, overdue for f/u - she will call [facility] to schedule mammogram. I will order diag mammo bilat      Relevant Orders   MM Digital Diagnostic Bilat    Other Visit Diagnoses    Special screening for malignant neoplasms, colon       Relevant Orders   Fecal occult blood, imunochemical       Meds ordered this encounter  Medications  . levothyroxine (SYNTHROID, LEVOTHROID) 125 MCG tablet    Sig: Take 1 tablet (125 mcg total) by mouth daily.    Dispense:  90 tablet    Refill:  3    Note new sig   Orders Placed This Encounter  Procedures  . Fecal occult blood, imunochemical    Standing Status:   Future    Standing Expiration Date:   12/14/2019  . MM Digital Diagnostic Bilat    Standing Status:   Future  Standing Expiration Date:   02/11/2020    Order Specific Question:   Reason for Exam (SYMPTOM  OR DIAGNOSIS REQUIRED)    Answer:   f/u breast calcifications    Order Specific Question:   Is the patient pregnant?    Answer:   No    Order Specific Question:   Preferred imaging location?    Answer:   [facility]  . Ambulatory referral to Dermatology    Referral Priority:   Routine    Referral Type:   Consultation    Referral Reason:   Specialty Services Required    Requested Specialty:   Dermatology    Number of Visits Requested:   1    Follow up plan: Return in about 1 year (around 12/14/2019) for annual exam, prior fasting for blood work.  [name], MD

## 2018-12-13 ENCOUNTER — Ambulatory Visit (INDEPENDENT_AMBULATORY_CARE_PROVIDER_SITE_OTHER): Admitting: Family Medicine

## 2018-12-13 ENCOUNTER — Encounter: Payer: Self-pay | Admitting: Family Medicine

## 2018-12-13 VITALS — BP 126/80 | HR 63 | Temp 98.4°F | Ht 64.5 in | Wt 154.2 lb

## 2018-12-13 DIAGNOSIS — R21 Rash and other nonspecific skin eruption: Secondary | ICD-10-CM | POA: Diagnosis not present

## 2018-12-13 DIAGNOSIS — Z1211 Encounter for screening for malignant neoplasm of colon: Secondary | ICD-10-CM

## 2018-12-13 DIAGNOSIS — R7989 Other specified abnormal findings of blood chemistry: Secondary | ICD-10-CM

## 2018-12-13 DIAGNOSIS — R921 Mammographic calcification found on diagnostic imaging of breast: Secondary | ICD-10-CM

## 2018-12-13 DIAGNOSIS — E785 Hyperlipidemia, unspecified: Secondary | ICD-10-CM | POA: Diagnosis not present

## 2018-12-13 DIAGNOSIS — E039 Hypothyroidism, unspecified: Secondary | ICD-10-CM

## 2018-12-13 DIAGNOSIS — Z0000 Encounter for general adult medical examination without abnormal findings: Secondary | ICD-10-CM | POA: Diagnosis not present

## 2018-12-13 MED ORDER — LEVOTHYROXINE SODIUM 125 MCG PO TABS: 125.0000 ug | ORAL_TABLET | Freq: Every day | ORAL | 3 refills | Status: DC

## 2018-12-13 NOTE — Patient Instructions (Addendum)
Pass by lab to pick up stool kit today.  Call to schedule appointment with Dr Hulan Fray for well woman exam. Call [facility] to schedule mammogram as you're overdue.  We will refer you to dermatology.  Return as needed or in 1 year for next physical.   Health Maintenance, Female Adopting a healthy lifestyle and getting preventive care can go a long way to promote health and wellness. Talk with your health care provider about what schedule of regular examinations is right for you. This is a good chance for you to check in with your provider about disease prevention and staying healthy. In between checkups, there are plenty of things you can do on your own. Experts have done a lot of research about which lifestyle changes and preventive measures are most likely to keep you healthy. Ask your health care provider for more information. Weight and diet Eat a healthy diet  Be sure to include plenty of vegetables, fruits, low-fat dairy products, and lean protein.  Do not eat a lot of foods high in solid fats, added sugars, or salt.  Get regular exercise. This is one of the most important things you can do for your health. ? Most adults should exercise for at least 150 minutes each week. The exercise should increase your heart rate and make you sweat (moderate-intensity exercise). ? Most adults should also do strengthening exercises at least twice a week. This is in addition to the moderate-intensity exercise. Maintain a healthy weight  Body mass index (BMI) is a measurement that can be used to identify possible weight problems. It estimates body fat based on height and weight. Your health care provider can help determine your BMI and help you achieve or maintain a healthy weight.  For females 87 years of age and older: ? A BMI below 18.5 is considered underweight. ? A BMI of 18.5 to 24.9 is normal. ? A BMI of 25 to 29.9 is considered overweight. ? A BMI of 30 and above is considered  obese. Watch levels of cholesterol and blood lipids  You should start having your blood tested for lipids and cholesterol at 57 years of age, then have this test every 5 years.  You may need to have your cholesterol levels checked more often if: ? Your lipid or cholesterol levels are high. ? You are older than 57 years of age. ? You are at high risk for heart disease. Cancer screening Lung Cancer  Lung cancer screening is recommended for adults 25-38 years old who are at high risk for lung cancer because of a history of smoking.  A yearly low-dose CT scan of the lungs is recommended for people who: ? Currently smoke. ? Have quit within the past 15 years. ? Have at least a 30-pack-year history of smoking. A pack year is smoking an average of one pack of cigarettes a day for 1 year.  Yearly screening should continue until it has been 15 years since you quit.  Yearly screening should stop if you develop a health problem that would prevent you from having lung cancer treatment. Breast Cancer  Practice breast self-awareness. This means understanding how your breasts normally appear and feel.  It also means doing regular breast self-exams. Let your health care provider know about any changes, no matter how small.  If you are in your 20s or 30s, you should have a clinical breast exam (CBE) by a health care provider every 1-3 years as part of a regular health exam.  If you are 40 or older, have a CBE every year. Also consider having a breast X-ray (mammogram) every year.  If you have a family history of breast cancer, talk to your health care provider about genetic screening.  If you are at high risk for breast cancer, talk to your health care provider about having an MRI and a mammogram every year.  Breast cancer gene (BRCA) assessment is recommended for women who have family members with BRCA-related cancers. BRCA-related cancers include: ? Breast. ? Ovarian. ? Tubal. ? Peritoneal  cancers.  Results of the assessment will determine the need for genetic counseling and BRCA1 and BRCA2 testing. Cervical Cancer Your health care provider may recommend that you be screened regularly for cancer of the pelvic organs (ovaries, uterus, and vagina). This screening involves a pelvic examination, including checking for microscopic changes to the surface of your cervix (Pap test). You may be encouraged to have this screening done every 3 years, beginning at age 21.  For women ages 30-65, health care providers may recommend pelvic exams and Pap testing every 3 years, or they may recommend the Pap and pelvic exam, combined with testing for human papilloma virus (HPV), every 5 years. Some types of HPV increase your risk of cervical cancer. Testing for HPV may also be done on women of any age with unclear Pap test results.  Other health care providers may not recommend any screening for nonpregnant women who are considered low risk for pelvic cancer and who do not have symptoms. Ask your health care provider if a screening pelvic exam is right for you.  If you have had past treatment for cervical cancer or a condition that could lead to cancer, you need Pap tests and screening for cancer for at least 20 years after your treatment. If Pap tests have been discontinued, your risk factors (such as having a new sexual partner) need to be reassessed to determine if screening should resume. Some women have medical problems that increase the chance of getting cervical cancer. In these cases, your health care provider may recommend more frequent screening and Pap tests. Colorectal Cancer  This type of cancer can be detected and often prevented.  Routine colorectal cancer screening usually begins at 57 years of age and continues through 57 years of age.  Your health care provider may recommend screening at an earlier age if you have risk factors for colon cancer.  Your health care provider may also  recommend using home test kits to check for hidden blood in the stool.  A small camera at the end of a tube can be used to examine your colon directly (sigmoidoscopy or colonoscopy). This is done to check for the earliest forms of colorectal cancer.  Routine screening usually begins at age 50.  Direct examination of the colon should be repeated every 5-10 years through 57 years of age. However, you may need to be screened more often if early forms of precancerous polyps or small growths are found. Skin Cancer  Check your skin from head to toe regularly.  Tell your health care provider about any new moles or changes in moles, especially if there is a change in a mole's shape or color.  Also tell your health care provider if you have a mole that is larger than the size of a pencil eraser.  Always use sunscreen. Apply sunscreen liberally and repeatedly throughout the day.  Protect yourself by wearing long sleeves, pants, a wide-brimmed hat, and sunglasses whenever you   are outside. Heart disease, diabetes, and high blood pressure  High blood pressure causes heart disease and increases the risk of stroke. High blood pressure is more likely to develop in: ? People who have blood pressure in the high end of the normal range (130-139/85-89 mm Hg). ? People who are overweight or obese. ? People who are African American.  If you are 31-4 years of age, have your blood pressure checked every 3-5 years. If you are 28 years of age or older, have your blood pressure checked every year. You should have your blood pressure measured twice-once when you are at a hospital or clinic, and once when you are not at a hospital or clinic. Record the average of the two measurements. To check your blood pressure when you are not at a hospital or clinic, you can use: ? An automated blood pressure machine at a pharmacy. ? A home blood pressure monitor.  If you are between 66 years and 2 years old, ask your health  care provider if you should take aspirin to prevent strokes.  Have regular diabetes screenings. This involves taking a blood sample to check your fasting blood sugar level. ? If you are at a normal weight and have a low risk for diabetes, have this test once every three years after 57 years of age. ? If you are overweight and have a high risk for diabetes, consider being tested at a younger age or more often. Preventing infection Hepatitis B  If you have a higher risk for hepatitis B, you should be screened for this virus. You are considered at high risk for hepatitis B if: ? You were born in a country where hepatitis B is common. Ask your health care provider which countries are considered high risk. ? Your parents were born in a high-risk country, and you have not been immunized against hepatitis B (hepatitis B vaccine). ? You have HIV or AIDS. ? You use needles to inject street drugs. ? You live with someone who has hepatitis B. ? You have had sex with someone who has hepatitis B. ? You get hemodialysis treatment. ? You take certain medicines for conditions, including cancer, organ transplantation, and autoimmune conditions. Hepatitis C  Blood testing is recommended for: ? Everyone born from [birth year] through 1964. ? Anyone with known risk factors for hepatitis C. Sexually transmitted infections (STIs)  You should be screened for sexually transmitted infections (STIs) including gonorrhea and chlamydia if: ? You are sexually active and are younger than 57 years of age. ? You are older than 57 years of age and your health care provider tells you that you are at risk for this type of infection. ? Your sexual activity has changed since you were last screened and you are at an increased risk for chlamydia or gonorrhea. Ask your health care provider if you are at risk.  If you do not have HIV, but are at risk, it may be recommended that you take a prescription medicine daily to prevent HIV  infection. This is called pre-exposure prophylaxis (PrEP). You are considered at risk if: ? You are sexually active and do not regularly use condoms or know the HIV status of your partner(s). ? You take drugs by injection. ? You are sexually active with a partner who has HIV. Talk with your health care provider about whether you are at high risk of being infected with HIV. If you choose to begin PrEP, you should first be tested for HIV. You  should then be tested every 3 months for as long as you are taking PrEP. Pregnancy  If you are premenopausal and you may become pregnant, ask your health care provider about preconception counseling.  If you may become pregnant, take 400 to 800 micrograms (mcg) of folic acid every day.  If you want to prevent pregnancy, talk to your health care provider about birth control (contraception). Osteoporosis and menopause  Osteoporosis is a disease in which the bones lose minerals and strength with aging. This can result in serious bone fractures. Your risk for osteoporosis can be identified using a bone density scan.  If you are 29 years of age or older, or if you are at risk for osteoporosis and fractures, ask your health care provider if you should be screened.  Ask your health care provider whether you should take a calcium or vitamin D supplement to lower your risk for osteoporosis.  Menopause may have certain physical symptoms and risks.  Hormone replacement therapy may reduce some of these symptoms and risks. Talk to your health care provider about whether hormone replacement therapy is right for you. Follow these instructions at home:  Schedule regular health, dental, and eye exams.  Stay current with your immunizations.  Do not use any tobacco products including cigarettes, chewing tobacco, or electronic cigarettes.  If you are pregnant, do not drink alcohol.  If you are breastfeeding, limit how much and how often you drink alcohol.  Limit  alcohol intake to no more than 1 drink per day for nonpregnant women. One drink equals 12 ounces of beer, 5 ounces of wine, or 1 ounces of hard liquor.  Do not use street drugs.  Do not share needles.  Ask your health care provider for help if you need support or information about quitting drugs.  Tell your health care provider if you often feel depressed.  Tell your health care provider if you have ever been abused or do not feel safe at home. This information is not intended to replace advice given to you by your health care provider. Make sure you discuss any questions you have with your health care provider. Document Released: 05/31/2011 Document Revised: 04/22/2016 Document Reviewed: 08/19/2015 Elsevier Interactive Patient Education  2019 [employer].

## 2018-12-13 NOTE — Assessment & Plan Note (Signed)
Preventative protocols reviewed and updated unless pt declined. Discussed healthy diet and lifestyle.  

## 2018-12-13 NOTE — Assessment & Plan Note (Addendum)
Chronic off meds. Anticipate hereditary hyperlipidemia - as she maintains a healthy diet.  The 10-year ASCVD risk score Mikey Bussing DC Brooke Bonito., et al., 2013) is: 2.8%   Values used to calculate the score:     Age: 58 years     Sex: Female     Is Non-Hispanic African American: No     Diabetic: No     Tobacco smoker: No     Systolic Blood Pressure: 147 mmHg     Is BP treated: No     HDL Cholesterol: 47.6 mg/dL     Total Cholesterol: 234 mg/dL

## 2018-12-13 NOTE — Assessment & Plan Note (Signed)
Present for years, not improving with emollient cream, not improved with cortisone-10 or lotrimin creams. Will refer to derm for further eval.

## 2018-12-13 NOTE — Assessment & Plan Note (Signed)
Levels have normalized on repeat testing.

## 2018-12-13 NOTE — Assessment & Plan Note (Addendum)
Reviewed with patient, overdue for f/u - she will call [facility] to schedule mammogram. I will order diag mammo bilat

## 2018-12-13 NOTE — Assessment & Plan Note (Signed)
Chronic, stable. TFTs stable.

## 2018-12-14 ENCOUNTER — Telehealth: Payer: Self-pay | Admitting: Family Medicine

## 2018-12-14 DIAGNOSIS — R921 Mammographic calcification found on diagnostic imaging of breast: Secondary | ICD-10-CM

## 2018-12-14 NOTE — Telephone Encounter (Signed)
Orders changed. 

## 2018-12-14 NOTE — Telephone Encounter (Signed)
I spoke with Crystal @ [facility] Order for dx mammogram needs to be changed to [id number] and please add [id number] left ultra sound and [id number] right ultra sound  Lattie Haw can you do this or do I need to send to dr g

## 2018-12-15 NOTE — Telephone Encounter (Signed)
Appointment 1/27 Spouse aware

## 2018-12-25 ENCOUNTER — Ambulatory Visit
Admission: RE | Admit: 2018-12-25 | Discharge: 2018-12-25 | Disposition: A | Discharge status: Home / Self Care | Source: Ambulatory Visit | Attending: Family Medicine | Admitting: Family Medicine

## 2018-12-25 DIAGNOSIS — R921 Mammographic calcification found on diagnostic imaging of breast: Secondary | ICD-10-CM | POA: Diagnosis present

## 2018-12-25 LAB — HM MAMMOGRAPHY

## 2018-12-26 ENCOUNTER — Encounter: Payer: Self-pay | Admitting: Family Medicine

## 2019-01-03 ENCOUNTER — Telehealth: Payer: Self-pay

## 2019-01-03 ENCOUNTER — Other Ambulatory Visit (INDEPENDENT_AMBULATORY_CARE_PROVIDER_SITE_OTHER)

## 2019-01-03 DIAGNOSIS — Z1211 Encounter for screening for malignant neoplasm of colon: Secondary | ICD-10-CM | POA: Diagnosis not present

## 2019-01-03 LAB — FECAL OCCULT BLOOD, IMMUNOCHEMICAL: FECAL OCCULT BLD: POSITIVE — AB

## 2019-01-03 NOTE — Telephone Encounter (Signed)
Noted  

## 2019-01-03 NOTE — Telephone Encounter (Signed)
Received message from Levada Dy (float helping in lab), stating the lab called with critical results.  Pt iFOB kit is positive ICT.

## 2019-01-04 ENCOUNTER — Other Ambulatory Visit: Payer: Self-pay | Admitting: Family Medicine

## 2019-01-04 DIAGNOSIS — Z1211 Encounter for screening for malignant neoplasm of colon: Secondary | ICD-10-CM

## 2019-01-10 ENCOUNTER — Telehealth: Payer: Self-pay | Admitting: Gastroenterology

## 2019-01-10 ENCOUNTER — Other Ambulatory Visit: Payer: Self-pay

## 2019-01-10 DIAGNOSIS — Z1211 Encounter for screening for malignant neoplasm of colon: Secondary | ICD-10-CM

## 2019-01-10 NOTE — Telephone Encounter (Signed)
Patient's husband returned Cheryl Brady's call

## 2019-01-10 NOTE — Telephone Encounter (Signed)
Call has been returned to patients husband.  He is on her DPR form.  Pts colonoscopy has been scheduled for  02/02/19 at [facility] with Dr. Bonna Gains.  Thanks [employer]

## 2019-02-02 ENCOUNTER — Encounter
Admission: RE | Disposition: A | Discharge status: Home / Self Care | Payer: Self-pay | Source: Home / Self Care | Attending: Gastroenterology

## 2019-02-02 ENCOUNTER — Ambulatory Visit
Admission: RE | Admit: 2019-02-02 | Discharge: 2019-02-02 | Disposition: A | Discharge status: Home / Self Care | Attending: Gastroenterology | Admitting: Gastroenterology

## 2019-02-02 ENCOUNTER — Encounter: Payer: Self-pay | Admitting: *Deleted

## 2019-02-02 ENCOUNTER — Ambulatory Visit: Admitting: Anesthesiology

## 2019-02-02 DIAGNOSIS — E039 Hypothyroidism, unspecified: Secondary | ICD-10-CM | POA: Diagnosis not present

## 2019-02-02 DIAGNOSIS — K635 Polyp of colon: Secondary | ICD-10-CM

## 2019-02-02 DIAGNOSIS — E785 Hyperlipidemia, unspecified: Secondary | ICD-10-CM | POA: Diagnosis not present

## 2019-02-02 DIAGNOSIS — I10 Essential (primary) hypertension: Secondary | ICD-10-CM | POA: Diagnosis not present

## 2019-02-02 DIAGNOSIS — R195 Other fecal abnormalities: Secondary | ICD-10-CM | POA: Diagnosis not present

## 2019-02-02 DIAGNOSIS — D122 Benign neoplasm of ascending colon: Secondary | ICD-10-CM | POA: Insufficient documentation

## 2019-02-02 DIAGNOSIS — Z888 Allergy status to other drugs, medicaments and biological substances status: Secondary | ICD-10-CM | POA: Diagnosis not present

## 2019-02-02 DIAGNOSIS — Z91040 Latex allergy status: Secondary | ICD-10-CM | POA: Insufficient documentation

## 2019-02-02 DIAGNOSIS — K5730 Diverticulosis of large intestine without perforation or abscess without bleeding: Secondary | ICD-10-CM

## 2019-02-02 DIAGNOSIS — Z79890 Hormone replacement therapy (postmenopausal): Secondary | ICD-10-CM | POA: Diagnosis not present

## 2019-02-02 DIAGNOSIS — Z1211 Encounter for screening for malignant neoplasm of colon: Secondary | ICD-10-CM

## 2019-02-02 DIAGNOSIS — Z87891 Personal history of nicotine dependence: Secondary | ICD-10-CM | POA: Diagnosis not present

## 2019-02-02 DIAGNOSIS — D125 Benign neoplasm of sigmoid colon: Secondary | ICD-10-CM | POA: Insufficient documentation

## 2019-02-02 HISTORY — PX: COLONOSCOPY WITH PROPOFOL: SHX5780

## 2019-02-02 SURGERY — COLONOSCOPY WITH PROPOFOL
Anesthesia: General

## 2019-02-02 MED ORDER — SODIUM CHLORIDE 0.9 % IV SOLN
INTRAVENOUS | Status: DC
  Administered 2019-02-02: 08:00:00 via INTRAVENOUS

## 2019-02-02 MED ORDER — PROPOFOL 500 MG/50ML IV EMUL
INTRAVENOUS | Status: DC | PRN
  Administered 2019-02-02: 140 ug/kg/min via INTRAVENOUS

## 2019-02-02 MED ORDER — PROPOFOL 10 MG/ML IV BOLUS
INTRAVENOUS | Status: DC | PRN
  Administered 2019-02-02: 70 mg via INTRAVENOUS
  Administered 2019-02-02: 30 mg via INTRAVENOUS

## 2019-02-02 NOTE — Anesthesia Postprocedure Evaluation (Signed)
Anesthesia Post Note  Patient: Cheryl Brady  Procedure(s) Performed: COLONOSCOPY WITH PROPOFOL (N/A )  Patient location during evaluation: PACU Anesthesia Type: General Level of consciousness: awake and alert Pain management: pain level controlled Vital Signs Assessment: post-procedure vital signs reviewed and stable Respiratory status: spontaneous breathing, nonlabored ventilation and respiratory function stable Cardiovascular status: blood pressure returned to baseline and stable Postop Assessment: no apparent nausea or vomiting Anesthetic complications: no     Last Vitals:  Vitals:   02/02/19 0853 02/02/19 0903  BP: 102/63 131/74  Pulse: 72 89  Resp: 14 (!) 21  Temp: 36.6 C   SpO2: 100% 100%    Last Pain:  Vitals:   02/02/19 0903  TempSrc:   PainSc: 0-No pain                 Durenda Hurt

## 2019-02-02 NOTE — Anesthesia Post-op Follow-up Note (Signed)
Anesthesia QCDR form completed.        

## 2019-02-02 NOTE — Op Note (Signed)
[facility] Patient Name: Cheryl Brady Procedure Date: 02/02/2019 7:28 AM MRN: [id number] Account #: [id number] Date of Birth: Sep 06, 1961 Admit Type: Outpatient Age: 58 Room: [facility] ENDO ROOM 4 Gender: Female Note Status: Finalized Procedure:            Colonoscopy Indications:          Gastrointestinal occult blood loss Providers:            Shelbie Franken B. Bonna Gains MD, MD Referring MD:         [name] (Referring MD) Medicines:            Monitored Anesthesia Care Complications:        No immediate complications. Procedure:            Pre-Anesthesia Assessment:                       - ASA Grade Assessment: II - A patient with mild                        systemic disease.                       - Prior to the procedure, a History and Physical was                        performed, and patient medications, allergies and                        sensitivities were reviewed. The patient's tolerance of                        previous anesthesia was reviewed.                       - The risks and benefits of the procedure and the                        sedation options and risks were discussed with the                        patient. All questions were answered and informed                        consent was obtained.                       - Patient identification and proposed procedure were                        verified prior to the procedure by the physician, the                        nurse, the anesthesiologist, the anesthetist and the                        technician. The procedure was verified in the procedure                        room.                       After obtaining  informed consent, the colonoscope was                        passed under direct vision. Throughout the procedure,                        the patient's blood pressure, pulse, and oxygen                        saturations were monitored continuously. The        Colonoscope was introduced through the anus and                        advanced to the the terminal ileum. The colonoscopy was                        performed with ease. The patient tolerated the                        procedure well. The quality of the bowel preparation                        was good. Findings:      The perianal and digital rectal examinations were normal.      A 4 mm polyp was found in the ascending colon. The polyp was sessile.       The polyp was removed with a cold biopsy forceps. Resection and       retrieval were complete.      A 15 mm polyp was found in the sigmoid colon. The polyp was       pedunculated. The polyp was removed with a hot snare. Resection and       retrieval were complete. To prevent bleeding after the polypectomy, one       hemostatic clip was successfully placed. There was no bleeding at the       end of the procedure.      Multiple diverticula were found in the sigmoid colon.      The exam was otherwise without abnormality.      The rectum, sigmoid colon, descending colon, transverse colon, ascending       colon, cecum and ileum appeared normal.      The retroflexed view of the distal rectum and anal verge was normal and       showed no anal or rectal abnormalities. Impression:           - One 4 mm polyp in the ascending colon, removed with a                        cold biopsy forceps. Resected and retrieved.                       - One 15 mm polyp in the sigmoid colon, removed with a                        hot snare. Resected and retrieved. Clip was placed.                       - Diverticulosis in the sigmoid colon.                       -  The examination was otherwise normal.                       - The rectum, sigmoid colon, descending colon,                        transverse colon, ascending colon, cecum and terminal                        ileum are normal.                       - The distal rectum and anal verge are normal on                         retroflexion view. Recommendation:       - Discharge patient to home (with escort).                       - Advance diet as tolerated.                       - Continue present medications.                       - Await pathology results.                       - Repeat colonoscopy in 3 years.                       - The findings and recommendations were discussed with                        the patient.                       - The findings and recommendations were discussed with                        the patient's family.                       - Return to primary care physician as previously                        scheduled.                       - High fiber diet. Procedure Code(s):    --- Professional ---                       [phone], Colonoscopy, flexible; with removal of tumor(s),                        polyp(s), or other lesion(s) by snare technique                       [id number], 36, Colonoscopy, flexible; with biopsy, single                        or multiple Diagnosis Code(s):    --- Professional ---  D12.2, Benign neoplasm of ascending colon                       D12.5, Benign neoplasm of sigmoid colon                       R19.5, Other fecal abnormalities                       K57.30, Diverticulosis of large intestine without                        perforation or abscess without bleeding CPT copyright 2018 American Medical Association. All rights reserved. The codes documented in this report are preliminary and upon coder review may  be revised to meet current compliance requirements.  [name], MD Margretta Sidle B. Bonna Gains MD, MD 02/02/2019 8:55:30 AM This report has been signed electronically. Number of Addenda: 0 Note Initiated On: 02/02/2019 7:28 AM Scope Withdrawal Time: 0 hours 20 minutes 8 seconds  Total Procedure Duration: 0 hours 31 minutes 19 seconds  Estimated Blood Loss: Estimated blood loss: none.      [facility]

## 2019-02-02 NOTE — Transfer of Care (Signed)
Immediate Anesthesia Transfer of Care Note  Patient: Cheryl Brady  Procedure(s) Performed: COLONOSCOPY WITH PROPOFOL (N/A )  Patient Location: Endoscopy Unit  Anesthesia Type:General  Level of Consciousness: drowsy  Airway & Oxygen Therapy: Patient connected to nasal cannula oxygen  Post-op Assessment: Post -op Vital signs reviewed and stable  Post vital signs: stable  Last Vitals:  Vitals Value Taken Time  BP    Temp    Pulse    Resp    SpO2      Last Pain:  Vitals:   02/02/19 0724  TempSrc: Tympanic  PainSc: 0-No pain         Complications: No apparent anesthesia complications

## 2019-02-02 NOTE — Anesthesia Preprocedure Evaluation (Addendum)
Anesthesia Evaluation  Patient identified by MRN, date of birth, ID band Patient awake    Reviewed: Allergy & Precautions, H&P , NPO status , Patient's Chart, lab work & pertinent test results  Airway Mallampati: II  TM Distance: >3 FB     Dental  (+) Poor Dentition   Pulmonary neg pulmonary ROS, neg COPD, former smoker,           Cardiovascular hypertension, (-) Past MI negative cardio ROS       Neuro/Psych  Headaches, negative psych ROS   GI/Hepatic negative GI ROS, Neg liver ROS,   Endo/Other  Hypothyroidism   Renal/GU negative Renal ROS  negative genitourinary   Musculoskeletal  (+) Fibromyalgia -  Abdominal   Peds  Hematology negative hematology ROS (+)   Anesthesia Other Findings Past Medical History: 2012: Fibromyalgia     Comment:  dx by prior PCP [id number]: History of diabetes mellitus     Comment:  [facility] [place] 03/2013, resolved with 53lb weight loss 2016 2007: History of iron deficiency anemia No date: Hyperlipidemia No date: Hypertension No date: Hypothyroid No date: Migraines     Comment:  silent migraines - dizzy and nauseated, photophobia No date: Perennial allergic rhinitis  Past Surgical History: No date: ABDOMINAL HYSTERECTOMY 1989: CHOLECYSTECTOMY 2007: LAPAROSCOPIC HYSTERECTOMY     Comment:  total, BSO, for heavy bleeding No date: TUBAL LIGATION     Reproductive/Obstetrics negative OB ROS                           Anesthesia Physical Anesthesia Plan  ASA: II  Anesthesia Plan: General   Post-op Pain Management:    Induction:   PONV Risk Score and Plan: Propofol infusion and TIVA  Airway Management Planned: Natural Airway and Nasal Cannula  Additional Equipment:   Intra-op Plan:   Post-operative Plan:   Informed Consent: I have reviewed the patients History and Physical, chart, labs and discussed the procedure including the risks, benefits and  alternatives for the proposed anesthesia with the patient or authorized representative who has indicated his/her understanding and acceptance.     Dental Advisory Given  Plan Discussed with: Anesthesiologist and CRNA  Anesthesia Plan Comments:         Anesthesia Quick Evaluation

## 2019-02-02 NOTE — H&P (Signed)
Cheryl Antigua, MD [address] [place], [place], [place], [address] [address], [place], [place], [place], [address] Phone: [phone]  Fax: [phone]  Primary Care Physician:  [name], MD   Pre-Procedure History & Physical: HPI:  Cheryl Brady is a 58 y.o. female is here for a colonoscopy.   Past Medical History:  Diagnosis Date  . Fibromyalgia 2012   dx by prior PCP  . History of diabetes mellitus 1997-2016   DSME [facility] 03/2013, resolved with 53lb weight loss 2016  . History of iron deficiency anemia 2007  . Hyperlipidemia   . Hypertension   . Hypothyroid   . Migraines    silent migraines - dizzy and nauseated, photophobia  . Perennial allergic rhinitis     Past Surgical History:  Procedure Laterality Date  . ABDOMINAL HYSTERECTOMY    . CHOLECYSTECTOMY  1989  . LAPAROSCOPIC HYSTERECTOMY  2007   total, BSO, for heavy bleeding  . TUBAL LIGATION      Prior to Admission medications   Medication Sig Start Date End Date Taking? Authorizing Provider  levothyroxine (SYNTHROID, LEVOTHROID) 125 MCG tablet Take 1 tablet (125 mcg total) by mouth daily. 12/13/18   [name], MD    Allergies as of 01/11/2019 - Review Complete 12/13/2018  Allergen Reaction Noted  . Amaryl [glimepiride] Nausea And Vomiting 01/01/2014  . Cymbalta [duloxetine hcl] Nausea Only 08/23/2012  . Januvia [sitagliptin] Other (See Comments) 09/12/2013  . Metformin and related  09/21/2012  . Adhesive [tape] Rash 08/23/2012  . Latex Rash 08/23/2012    Family History  Problem Relation Age of Onset  . Stroke Mother   . Coronary artery disease Father   . Stroke Father   . ALS Father        familial  . ALS Paternal Aunt        familial  . Breast cancer Paternal Aunt 49  . ALS Cousin        x2, familial   . Diabetes Neg Hx   . Cancer Neg Hx     Social History   Socioeconomic History  . Marital status: Married    Spouse name: Not on file  . Number of children:  Not on file  . Years of education: Not on file  . Highest education level: Not on file  Occupational History  . Not on file  Social Needs  . Financial resource strain: Not on file  . Food insecurity:    Worry: Not on file    Inability: Not on file  . Transportation needs:    Medical: Not on file    Non-medical: Not on file  Tobacco Use  . Smoking status: Former Research scientist (life sciences)  . Smokeless tobacco: Never Used  Substance and Sexual Activity  . Alcohol use: No  . Drug use: No  . Sexual activity: Yes    Birth control/protection: Surgical    Comment: Hysterectomy  Lifestyle  . Physical activity:    Days per week: Not on file    Minutes per session: Not on file  . Stress: Not on file  Relationships  . Social connections:    Talks on phone: Not on file    Gets together: Not on file    Attends religious service: Not on file    Active member of club or organization: Not on file    Attends meetings of clubs or organizations: Not on file    Relationship status: Not on file  . Intimate partner violence:  Fear of current or ex partner: Not on file    Emotionally abused: Not on file    Physically abused: Not on file    Forced sexual activity: Not on file  Other Topics Concern  . 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 Neck:  Supple; no masses or thyromegaly. Lungs:  Clear throughout to auscultation, normal respiratory effort.    Heart:  +S1, +S2, Regular rate and rhythm, No  edema. Abdomen:  Soft, nontender and nondistended. Normal bowel sounds, without guarding, and without rebound.   Neurologic:  Alert and  oriented x4;  grossly normal neurologically.  Impression/Plan: [name] is here for a colonoscopy to be performed for positive fecal occult blood test.   Risks, benefits, limitations, and alternatives regarding  colonoscopy have been reviewed with the patient.  Questions have been answered.  All parties agreeable.   Virgel Manifold, MD  02/02/2019, 8:10 AM

## 2019-02-03 ENCOUNTER — Encounter: Payer: Self-pay | Admitting: Gastroenterology

## 2019-02-06 LAB — SURGICAL PATHOLOGY

## 2019-02-10 ENCOUNTER — Encounter: Payer: Self-pay | Admitting: Family Medicine

## 2019-02-13 ENCOUNTER — Other Ambulatory Visit: Payer: Self-pay

## 2019-02-13 ENCOUNTER — Observation Stay
Admission: EM | Admit: 2019-02-13 | Discharge: 2019-02-15 | Disposition: A | Discharge status: Home / Self Care | Attending: Internal Medicine | Admitting: Internal Medicine

## 2019-02-13 DIAGNOSIS — K625 Hemorrhage of anus and rectum: Secondary | ICD-10-CM

## 2019-02-13 DIAGNOSIS — G709 Myoneural disorder, unspecified: Secondary | ICD-10-CM | POA: Insufficient documentation

## 2019-02-13 DIAGNOSIS — E119 Type 2 diabetes mellitus without complications: Secondary | ICD-10-CM | POA: Insufficient documentation

## 2019-02-13 DIAGNOSIS — K922 Gastrointestinal hemorrhage, unspecified: Secondary | ICD-10-CM | POA: Diagnosis present

## 2019-02-13 DIAGNOSIS — K5730 Diverticulosis of large intestine without perforation or abscess without bleeding: Secondary | ICD-10-CM | POA: Insufficient documentation

## 2019-02-13 DIAGNOSIS — Z8249 Family history of ischemic heart disease and other diseases of the circulatory system: Secondary | ICD-10-CM | POA: Diagnosis not present

## 2019-02-13 DIAGNOSIS — K633 Ulcer of intestine: Secondary | ICD-10-CM | POA: Diagnosis not present

## 2019-02-13 DIAGNOSIS — M797 Fibromyalgia: Secondary | ICD-10-CM | POA: Diagnosis not present

## 2019-02-13 DIAGNOSIS — K921 Melena: Secondary | ICD-10-CM | POA: Diagnosis present

## 2019-02-13 DIAGNOSIS — Z888 Allergy status to other drugs, medicaments and biological substances status: Secondary | ICD-10-CM | POA: Insufficient documentation

## 2019-02-13 DIAGNOSIS — Z91040 Latex allergy status: Secondary | ICD-10-CM | POA: Insufficient documentation

## 2019-02-13 DIAGNOSIS — Z79890 Hormone replacement therapy (postmenopausal): Secondary | ICD-10-CM | POA: Diagnosis not present

## 2019-02-13 DIAGNOSIS — I10 Essential (primary) hypertension: Secondary | ICD-10-CM | POA: Insufficient documentation

## 2019-02-13 DIAGNOSIS — Z87891 Personal history of nicotine dependence: Secondary | ICD-10-CM | POA: Diagnosis not present

## 2019-02-13 DIAGNOSIS — K640 First degree hemorrhoids: Secondary | ICD-10-CM | POA: Diagnosis not present

## 2019-02-13 DIAGNOSIS — Z86010 Personal history of colonic polyps: Secondary | ICD-10-CM | POA: Diagnosis not present

## 2019-02-13 DIAGNOSIS — E039 Hypothyroidism, unspecified: Secondary | ICD-10-CM | POA: Diagnosis not present

## 2019-02-13 LAB — CBC WITH DIFFERENTIAL/PLATELET
Abs Immature Granulocytes: 0.03 10*3/uL (ref 0.00–0.07)
BASOS ABS: 0 10*3/uL (ref 0.0–0.1)
Basophils Relative: 0 %
Eosinophils Absolute: 0.3 10*3/uL (ref 0.0–0.5)
Eosinophils Relative: 3 %
HCT: 42.8 % (ref 36.0–46.0)
Hemoglobin: 14.3 g/dL (ref 12.0–15.0)
Immature Granulocytes: 0 %
Lymphocytes Relative: 47 %
Lymphs Abs: 5.2 10*3/uL — ABNORMAL HIGH (ref 0.7–4.0)
MCH: 30.8 pg (ref 26.0–34.0)
MCHC: 33.4 g/dL (ref 30.0–36.0)
MCV: 92 fL (ref 80.0–100.0)
Monocytes Absolute: 0.7 10*3/uL (ref 0.1–1.0)
Monocytes Relative: 6 %
NRBC: 0 % (ref 0.0–0.2)
Neutro Abs: 4.9 10*3/uL (ref 1.7–7.7)
Neutrophils Relative %: 44 %
Platelets: 312 10*3/uL (ref 150–400)
RBC: 4.65 MIL/uL (ref 3.87–5.11)
RDW: 12.7 % (ref 11.5–15.5)
WBC: 11.2 10*3/uL — ABNORMAL HIGH (ref 4.0–10.5)

## 2019-02-13 LAB — COMPREHENSIVE METABOLIC PANEL
ALT: 13 U/L (ref 0–44)
AST: 19 U/L (ref 15–41)
Albumin: 4.6 g/dL (ref 3.5–5.0)
Alkaline Phosphatase: 69 U/L (ref 38–126)
Anion gap: 11 (ref 5–15)
BUN: 11 mg/dL (ref 6–20)
CO2: 25 mmol/L (ref 22–32)
Calcium: 9.7 mg/dL (ref 8.9–10.3)
Chloride: 106 mmol/L (ref 98–111)
Creatinine, Ser: 0.67 mg/dL (ref 0.44–1.00)
GFR calc Af Amer: >60 mL/min (ref >60)
GFR calc non Af Amer: >60 mL/min (ref >60)
Glucose, Bld: 119 mg/dL — ABNORMAL HIGH (ref 70–99)
Potassium: 3.6 mmol/L (ref 3.5–5.1)
Sodium: 142 mmol/L (ref 135–145)
Total Bilirubin: 0.6 mg/dL (ref 0.3–1.2)
Total Protein: 7.4 g/dL (ref 6.5–8.1)

## 2019-02-13 LAB — PROTIME-INR
INR: 0.9 (ref 0.8–1.2)
Prothrombin Time: 12.4 seconds (ref 11.4–15.2)

## 2019-02-13 MED ORDER — TRANEXAMIC ACID-NACL 1000-0.7 MG/100ML-% IV SOLN
1000.0000 mg | Freq: Once | INTRAVENOUS | Status: DC
  Filled 2019-02-13: qty 100

## 2019-02-13 NOTE — ED Notes (Signed)
Pt stated that after eating dinner she has had 4 episodes of diarrhea with bright red blood. Denies any pain.

## 2019-02-13 NOTE — ED Triage Notes (Signed)
Pt in with co rectal bleeding since tonight, states 3/06 had a colonoscopy where they clipped a polyp. States has had 4 bloody stools tonight.

## 2019-02-13 NOTE — ED Provider Notes (Addendum)
[facility] Emergency Department Provider Note  ____________________________________________   I have reviewed the triage vital signs and the nursing notes. Where available I have reviewed prior notes and, if possible and indicated, outside hospital notes.    HISTORY  Chief Complaint Rectal Bleeding    HPI Cheryl Brady is a 58 y.o. female  You had polyps removed about a week ago, when she had a colonoscopy.  She has not had any bleeding before this, but has been having stools that are normal. This evening however she had 4 stools which she described as "a lot" of blood.  She is not had any abdominal pain fever lightheadedness.  She is not on any blood thinners aside from aspirin.  She has not had any rectal bleeding of any significance before she did have guaiac positive stool in February which is why they did the colonoscopy.   Past Medical History:  Diagnosis Date  . Fibromyalgia 2012   dx by prior PCP  . History of diabetes mellitus 1997-2016   [facility] 03/2013, resolved with 53lb weight loss 2016  . History of iron deficiency anemia 2007  . Hyperlipidemia   . Hypertension   . Hypothyroid   . Migraines    silent migraines - dizzy and nauseated, photophobia  . Perennial allergic rhinitis     Patient Active Problem List   Diagnosis Date Noted  . OB + stool   . Polyp of sigmoid colon   . Diverticulosis of large intestine without diverticulitis   . Benign neoplasm of ascending colon   . High serum ferritin 10/04/2018  . Floaters, right 12/13/2016  . Breast calcifications on mammogram 11/05/2015  . Rash of face 05/01/2015  . Hip pain, bilateral 09/26/2014  . intermetatarsal bursitis L foot 01/01/2014  . Hyperlipidemia   . Healthcare maintenance 09/21/2012  . Perennial allergic rhinitis   . Migraines   . Hypothyroid   . Fibromyalgia     Past Surgical History:  Procedure Laterality Date  . ABDOMINAL HYSTERECTOMY    . CHOLECYSTECTOMY   1989  . COLONOSCOPY WITH PROPOFOL N/A 02/02/2019   TAx2 (one 1.5 cm), diverticulosis, rpt 3 yrs (Tahiliani, Lennette Bihari, MD)  . LAPAROSCOPIC HYSTERECTOMY  2007   total, BSO, for heavy bleeding  . TUBAL LIGATION      Prior to Admission medications   Medication Sig Start Date End Date Taking? Authorizing Provider  levothyroxine (SYNTHROID, LEVOTHROID) 125 MCG tablet Take 1 tablet (125 mcg total) by mouth daily. 12/13/18   [name], MD    Allergies Amaryl [glimepiride]; Cymbalta [duloxetine hcl]; Januvia [sitagliptin]; Metformin and related; Adhesive [tape]; and Latex  Family History  Problem Relation Age of Onset  . Stroke Mother   . Coronary artery disease Father   . Stroke Father   . ALS Father        familial  . ALS Paternal Aunt        familial  . Breast cancer Paternal Aunt 41  . ALS Cousin        x2, familial   . Diabetes Neg Hx   . Cancer Neg Hx     Social History Social History   Tobacco Use  . Smoking status: Former Research scientist (life sciences)  . Smokeless tobacco: Never Used  Substance Use Topics  . Alcohol use: No  . Drug use: No    Review of Systems Constitutional: No fever/chills Eyes: No visual changes. ENT: No sore throat. No stiff neck no neck pain Cardiovascular: Denies  chest pain. Respiratory: Denies shortness of breath. Gastrointestinal:   no vomiting.  No diarrhea.  No constipation. Genitourinary: Negative for dysuria. Musculoskeletal: Negative lower extremity swelling Skin: Negative for rash. Neurological: Negative for severe headaches, focal weakness or numbness.   ____________________________________________   PHYSICAL EXAM:  VITAL SIGNS: ED Triage Vitals  Enc Vitals Group     BP 02/13/19 2228 (!) 162/86     Pulse Rate 02/13/19 2228 80     Resp 02/13/19 2228 20     Temp 02/13/19 2228 97.7 F (36.5 C)     Temp Source 02/13/19 2228 Oral     SpO2 02/13/19 2228 98 %     Weight 02/13/19 2227 155 lb (70.3 kg)     Height 02/13/19 2227 5\' 5"  (1.651  m)     Head Circumference --      Peak Flow --      Pain Score 02/13/19 2227 6     Pain Loc --      Pain Edu? --      Excl. in [place]? --     Constitutional: Alert and oriented. Well appearing and in no acute distress. Eyes: Conjunctivae are normal Head: Atraumatic HEENT: No congestion/rhinnorhea. Mucous membranes are moist.  Oropharynx non-erythematous Neck:   Nontender with no meningismus, no masses, no stridor Cardiovascular: Normal rate, regular rhythm. Grossly normal heart sounds.  Good peripheral circulation. Respiratory: Normal respiratory effort.  No retractions. Lungs CTAB. Abdominal: Soft and nontender. No distention. No guarding no rebound Back:  There is no focal tenderness or step off.  there is no midline tenderness there are no lesions noted. there is no CVA tenderness Rectal exam: Female chaperone present, husband also present at patient's request, there is no obvious rectal lesion, there is no significant bleeding at this time but there is dried red blood around the rectum Musculoskeletal: No lower extremity tenderness, no upper extremity tenderness. No joint effusions, no DVT signs strong distal pulses no edema Neurologic:  Normal speech and language. No gross focal neurologic deficits are appreciated.  Skin:  Skin is warm, dry and intact. No rash noted. Psychiatric: Mood and affect are normal. Speech and behavior are normal.  ____________________________________________   LABS (all labs ordered are listed, but only abnormal results are displayed)  Labs Reviewed  CBC WITH DIFFERENTIAL/PLATELET  COMPREHENSIVE METABOLIC PANEL  PROTIME-INR    Pertinent labs  results that were available during my care of the patient were reviewed by me and considered in my medical decision making (see chart for details). ____________________________________________  EKG  I personally interpreted any EKGs ordered by me or  triage  ____________________________________________  RADIOLOGY  Pertinent labs & imaging results that were available during my care of the patient were reviewed by me and considered in my medical decision making (see chart for details). If possible, patient and/or family made aware of any abnormal findings.  No results found. ____________________________________________    PROCEDURES  Procedure(s) performed: None  Procedures  Critical Care performed: None  ____________________________________________   INITIAL IMPRESSION / ASSESSMENT AND PLAN / ED COURSE  Pertinent labs & imaging results that were available during my care of the patient were reviewed by me and considered in my medical decision making (see chart for details).  Patient here with rectal bleeding, started this evening, recent intrarectal polypectomy likely the cause.  Is on aspirin.  No way to know how much bleeding she has had.  Patient descriptions of quantities of rectal bleeding are always difficult to measure.  She states it is "a lot" of red blood.  Nothing to suggest upper GI bleed or perforation.  Abdomen is benign.  We will check blood work and discussed with GI what they wish to do.  Likely observe in the department she denies history of blood clots, and I think would be a good candidate therefore to for TXA as her preference would be to go home if possible.  Signed out at the end of my shift to Dr. Owens Shark.    ____________________________________________   FINAL CLINICAL IMPRESSION(S) / ED DIAGNOSES  Final diagnoses:  None      This chart was dictated using voice recognition software.  Despite best efforts to proofread,  errors can occur which can change meaning.      Schuyler Amor, MD 02/13/19 2245    Schuyler Amor, MD 02/13/19 [phone]

## 2019-02-14 ENCOUNTER — Other Ambulatory Visit

## 2019-02-14 ENCOUNTER — Emergency Department

## 2019-02-14 DIAGNOSIS — K922 Gastrointestinal hemorrhage, unspecified: Secondary | ICD-10-CM | POA: Diagnosis present

## 2019-02-14 LAB — HEMOGLOBIN A1C
Hgb A1c MFr Bld: 4.9 % (ref 4.8–5.6)
Mean Plasma Glucose: 93.93 mg/dL

## 2019-02-14 LAB — GASTROINTESTINAL PANEL BY PCR, STOOL (REPLACES STOOL CULTURE)
Adenovirus F40/41: NOT DETECTED
Astrovirus: NOT DETECTED
CRYPTOSPORIDIUM: NOT DETECTED
Campylobacter species: NOT DETECTED
Cyclospora cayetanensis: NOT DETECTED
Entamoeba histolytica: NOT DETECTED
Enteroaggregative E coli (EAEC): NOT DETECTED
Enteropathogenic E coli (EPEC): NOT DETECTED
Enterotoxigenic E coli (ETEC): NOT DETECTED
Giardia lamblia: NOT DETECTED
Norovirus GI/GII: NOT DETECTED
PLESIMONAS SHIGELLOIDES: NOT DETECTED
Rotavirus A: NOT DETECTED
SHIGELLA/ENTEROINVASIVE E COLI (EIEC): NOT DETECTED
Salmonella species: NOT DETECTED
Sapovirus (I, II, IV, and V): NOT DETECTED
Shiga like toxin producing E coli (STEC): NOT DETECTED
Vibrio cholerae: NOT DETECTED
Vibrio species: NOT DETECTED
YERSINIA ENTEROCOLITICA: NOT DETECTED

## 2019-02-14 LAB — BASIC METABOLIC PANEL
ANION GAP: 9 (ref 5–15)
BUN: 8 mg/dL (ref 6–20)
CO2: 25 mmol/L (ref 22–32)
Calcium: 9.4 mg/dL (ref 8.9–10.3)
Chloride: 108 mmol/L (ref 98–111)
Creatinine, Ser: 0.45 mg/dL (ref 0.44–1.00)
GFR calc Af Amer: >60 mL/min (ref >60)
GFR calc non Af Amer: >60 mL/min (ref >60)
Glucose, Bld: 110 mg/dL — ABNORMAL HIGH (ref 70–99)
POTASSIUM: 3.8 mmol/L (ref 3.5–5.1)
Sodium: 142 mmol/L (ref 135–145)

## 2019-02-14 LAB — GLUCOSE, CAPILLARY
GLUCOSE-CAPILLARY: 105 mg/dL — AB (ref 70–99)
Glucose-Capillary: 94 mg/dL (ref 70–99)
Glucose-Capillary: 91 mg/dL (ref 70–99)
Glucose-Capillary: 134 mg/dL — ABNORMAL HIGH (ref 70–99)
Glucose-Capillary: 108 mg/dL — ABNORMAL HIGH (ref 70–99)

## 2019-02-14 LAB — HEMOGLOBIN
HEMOGLOBIN: 13.5 g/dL (ref 12.0–15.0)
Hemoglobin: 13 g/dL (ref 12.0–15.0)
Hemoglobin: 12.8 g/dL (ref 12.0–15.0)

## 2019-02-14 LAB — CBC
HCT: 43.7 % (ref 36.0–46.0)
Hemoglobin: 14.2 g/dL (ref 12.0–15.0)
MCH: 30.3 pg (ref 26.0–34.0)
MCHC: 32.5 g/dL (ref 30.0–36.0)
MCV: 93.4 fL (ref 80.0–100.0)
NRBC: 0 % (ref 0.0–0.2)
Platelets: 281 10*3/uL (ref 150–400)
RBC: 4.68 MIL/uL (ref 3.87–5.11)
RDW: 12.8 % (ref 11.5–15.5)
WBC: 9.5 10*3/uL (ref 4.0–10.5)

## 2019-02-14 LAB — C DIFFICILE QUICK SCREEN W PCR REFLEX
C Diff antigen: NEGATIVE
C Diff interpretation: NOT DETECTED
C Diff toxin: NEGATIVE

## 2019-02-14 LAB — HEMOGLOBIN AND HEMATOCRIT, BLOOD
HCT: 38.7 % (ref 36.0–46.0)
Hemoglobin: 12.9 g/dL (ref 12.0–15.0)

## 2019-02-14 LAB — TSH: TSH: 4.937 u[IU]/mL — AB (ref 0.350–4.500)

## 2019-02-14 LAB — HEMATOCRIT
HCT: 40.1 % (ref 36.0–46.0)
HCT: 38.8 % (ref 36.0–46.0)
HCT: 38.7 % (ref 36.0–46.0)

## 2019-02-14 MED ORDER — LABETALOL HCL 5 MG/ML IV SOLN: 20.0000 mg | INTRAVENOUS | Status: DC | PRN

## 2019-02-14 MED ORDER — TRAZODONE HCL 50 MG PO TABS: 25.0000 mg | ORAL_TABLET | Freq: Every evening | ORAL | Status: DC | PRN

## 2019-02-14 MED ORDER — ACETAMINOPHEN 325 MG PO TABS: 650.0000 mg | ORAL_TABLET | Freq: Four times a day (QID) | ORAL | Status: DC | PRN

## 2019-02-14 MED ORDER — PEG 3350-KCL-NA BICARB-NACL 420 G PO SOLR
4000.0000 mL | Freq: Once | ORAL | Status: AC
  Administered 2019-02-14: 4000 mL via ORAL
  Filled 2019-02-14: qty 4000

## 2019-02-14 MED ORDER — HYDRALAZINE HCL 20 MG/ML IJ SOLN: 10.0000 mg | Freq: Four times a day (QID) | INTRAMUSCULAR | Status: DC | PRN

## 2019-02-14 MED ORDER — LEVOTHYROXINE SODIUM 50 MCG PO TABS
125.0000 ug | ORAL_TABLET | Freq: Every day | ORAL | Status: DC
  Administered 2019-02-14 – 2019-02-15 (×2): 125 ug via ORAL
  Filled 2019-02-14 (×2): qty 1

## 2019-02-14 MED ORDER — IOPAMIDOL (ISOVUE-370) INJECTION 76%
125.0000 mL | Freq: Once | INTRAVENOUS | Status: AC | PRN
  Administered 2019-02-14: 125 mL via INTRAVENOUS

## 2019-02-14 MED ORDER — POTASSIUM CHLORIDE CRYS ER 20 MEQ PO TBCR
40.0000 meq | EXTENDED_RELEASE_TABLET | Freq: Once | ORAL | Status: AC
  Administered 2019-02-14: 40 meq via ORAL
  Filled 2019-02-14: qty 2

## 2019-02-14 MED ORDER — SODIUM CHLORIDE 0.9 % IV SOLN
INTRAVENOUS | Status: DC
  Administered 2019-02-14 – 2019-02-15 (×3): via INTRAVENOUS

## 2019-02-14 MED ORDER — PANTOPRAZOLE SODIUM 40 MG IV SOLR
40.0000 mg | INTRAVENOUS | Status: DC
  Administered 2019-02-14 – 2019-02-15 (×2): 40 mg via INTRAVENOUS
  Filled 2019-02-14 (×2): qty 40

## 2019-02-14 MED ORDER — ONDANSETRON HCL 4 MG/2ML IJ SOLN
4.0000 mg | Freq: Four times a day (QID) | INTRAMUSCULAR | Status: DC | PRN
  Administered 2019-02-14: 4 mg via INTRAVENOUS
  Filled 2019-02-14: qty 2

## 2019-02-14 MED ORDER — ONDANSETRON HCL 4 MG PO TABS: 4.0000 mg | ORAL_TABLET | Freq: Four times a day (QID) | ORAL | Status: DC | PRN

## 2019-02-14 MED ORDER — INSULIN ASPART 100 UNIT/ML ~~LOC~~ SOLN: 0.0000 [IU] | SUBCUTANEOUS | Status: DC

## 2019-02-14 MED ORDER — ACETAMINOPHEN 650 MG RE SUPP: 650.0000 mg | Freq: Four times a day (QID) | RECTAL | Status: DC | PRN

## 2019-02-14 NOTE — H&P (Addendum)
[facility] [place] NAME: Cheryl Brady    MR#:  [id number]  DATE OF BIRTH:  1961-04-14  DATE OF ADMISSION:  02/13/2019  PRIMARY CARE PHYSICIAN: [name], MD   REQUESTING/REFERRING PHYSICIAN: Marjean Donna, MD CHIEF COMPLAINT:   Chief Complaint  Patient presents with  . Rectal Bleeding    HISTORY OF PRESENT ILLNESS:  Cheryl Brady  is a 58 y.o. female with a known history of diverticulosis, hypothyroidism, diet managed hypertension and diabetes mellitus and recent colonoscopy on 02/02/2019 by Dr. Bonna Gains that revealed an ascending colon 4 mm polyp as well as a 15 mm sigmoid polyp that both came back as tubular adenomas, who presented to the emergency room with acute onset of recurrent bright red bleeding per rectum since 10:30 PM.  She stated that over 30 minutes she had 4 bloody bowel movements.  She denied any melena or mucus in her stools.  She admitted to mild nausea without vomiting.  No fever or chills.  She felt fatigued and tired but denied any presyncope or syncope.  No chest pain or palpitations or dyspnea.  No other bleeding diathesis.  No cough or wheezing or hemoptysis.  No dysuria, oliguria or hematuria, urgency or frequency or flank pain.  Upon presentation to the emergency room, her blood pressure was elevated 162/86 with otherwise normal vital signs.  Labs were remarkable for CBC with hemoglobin of 14.3 and hematocrit of 42.8 that dropped to 12.9/38.7 after a bloody bowel movement in the ER.Marland Kitchen  Her potassium was 3.6 and sodium 142 and BUN 11 with creatinine of 0.67 with normal LFTs.  INR 0.9 PT 12.4.  An abdominal and pelvic CT scan without contrast was ordered and is currently pending.  The patient will be admitted to a medically monitored bed for further evaluation and management. PAST MEDICAL HISTORY:   Past Medical History:  Diagnosis Date  . Fibromyalgia 2012   dx by prior PCP  . History of diabetes mellitus  1997-2016   DSME [facility] 03/2013, resolved with 53lb weight loss 2016  . History of iron deficiency anemia 2007  . Hyperlipidemia   . Hypertension   . Hypothyroid   . Migraines    silent migraines - dizzy and nauseated, photophobia  . Perennial allergic rhinitis     PAST SURGICAL HISTORY:   Past Surgical History:  Procedure Laterality Date  . ABDOMINAL HYSTERECTOMY    . CHOLECYSTECTOMY  1989  . COLONOSCOPY WITH PROPOFOL N/A 02/02/2019   TAx2 (one 1.5 cm), diverticulosis, rpt 3 yrs (Tahiliani, Lennette Bihari, MD)  . LAPAROSCOPIC HYSTERECTOMY  2007   total, BSO, for heavy bleeding  . TUBAL LIGATION      SOCIAL HISTORY:   Social History   Tobacco Use  . Smoking status: Former Research scientist (life sciences)  . Smokeless tobacco: Never Used  Substance Use Topics  . Alcohol use: No    FAMILY HISTORY:   Family History  Problem Relation Age of Onset  . Stroke Mother   . Coronary artery disease Father   . Stroke Father   . ALS Father        familial  . ALS Paternal Aunt        familial  . Breast cancer Paternal Aunt 11  . ALS Cousin        x2, familial   . Diabetes Neg Hx   . Cancer Neg Hx     DRUG ALLERGIES:   Allergies  Allergen Reactions  .  Amaryl [Glimepiride] Nausea And Vomiting  . Cymbalta [Duloxetine Hcl] Nausea Only  . Januvia [Sitagliptin] Other (See Comments)    Somnolence and GI upset  . Metformin And Related     Nausea and diarrhea - does not want to take  . Adhesive [Tape] Rash  . Latex Rash    REVIEW OF SYSTEMS:   ROS As per history of present illness. All pertinent systems were reviewed above. Constitutional,  HEENT, cardiovascular, respiratory, GI, GU, musculoskeletal, neuro, psychiatric, endocrine,  integumentary and hematologic systems were reviewed and are otherwise  negative/unremarkable except for positive findings mentioned above in the HPI.   MEDICATIONS AT HOME:   Prior to Admission medications   Medication Sig Start Date End Date Taking? Authorizing  Provider  levothyroxine (SYNTHROID, LEVOTHROID) 125 MCG tablet Take 1 tablet (125 mcg total) by mouth daily. 12/13/18  Yes [name], MD      VITAL SIGNS:  Blood pressure (!) 162/86, pulse 80, temperature 97.7 F (36.5 C), temperature source Oral, resp. rate 20, height 5\' 5"  (1.651 m), weight 70.3 kg, SpO2 98 %.  PHYSICAL EXAMINATION:  Physical Exam  GENERAL:  58 y.o.-year-old pleasant Caucasian female patient lying in the bed with no acute distress.  EYES: Pupils equal, round, reactive to light and accommodation. No scleral icterus. Extraocular muscles intact.  HEENT: Head atraumatic, normocephalic. Oropharynx and nasopharynx clear.  NECK:  Supple, no jugular venous distention. No thyroid enlargement, no tenderness.  LUNGS: Normal breath sounds bilaterally, no wheezing, rales,rhonchi or crepitation. No use of accessory muscles of respiration.  CARDIOVASCULAR: S1, S2 normal. No murmurs, rubs, or gallops.  ABDOMEN: Soft, nontender, nondistended. Bowel sounds present. No organomegaly or mass.  EXTREMITIES: No pedal edema, cyanosis, or clubbing.  NEUROLOGIC: Cranial nerves II through XII are intact. Muscle strength 5/5 in all extremities. Sensation intact. Gait not checked.  PSYCHIATRIC: The patient is alert and oriented x 3.  SKIN: No obvious rash, lesion, or ulcer.   LABORATORY PANEL:   CBC Recent Labs  Lab 02/13/19 2236 02/14/19 0127  WBC 11.2*  --   HGB 14.3 12.9  HCT 42.8 38.7  PLT 312  --    ------------------------------------------------------------------------------------------------------------------  Chemistries  Recent Labs  Lab 02/13/19 2236  NA 142  K 3.6  CL 106  CO2 25  GLUCOSE 119*  BUN 11  CREATININE 0.67  CALCIUM 9.7  AST 19  ALT 13  ALKPHOS 69  BILITOT 0.6   ------------------------------------------------------------------------------------------------------------------  Cardiac Enzymes No results for input(s): TROPONINI in the last  168 hours. ------------------------------------------------------------------------------------------------------------------  RADIOLOGY:  Abdominal and pelvic CT scan revealed: VASCULAR:  1. No emergent finding.  No specific source of active GI bleeding. 2. Atherosclerosis.  NON-VASCULAR  Colonic diverticulosis. A luminal clip is seen at the level of the sigmoid colon.   IMPRESSION AND PLAN:   #1.  Rectal bleeding likely of acute lower GI bleeding source, probably related to sigmoid diverticulosis.  Patient is status post colonoscopy on 02/02/2019 by Dr. Bonna Gains that revealed 2 polyps that were resected, 1 ascending colonic 4 mm polyp that came back tubular adenoma and another 15 mm sigmoid colonic polyp that also came back as tubular adenoma.  The patient could be bleeding at the site of the biopsy.  An abdominal andpelvicCTAscan results as above.  Stool studies including C. difficile and GI PCR panel came back negative.  I therefore believe the patient does not need any antibiotics at this time.   I also ordered a GI bleeding scan for further  assessment.  We will obtain a GI consultation.  Dr. Vicente Males is on call for Dr. Bonna Gains.  I contacted her regarding the patient.  2..  Hypothyroidism.  Will check TSH and continue her Synthroid.  3.  Uncontrolled hypertension.  Her blood pressure was initially elevated.  She is apparently diet managed.  I will place her on PRN IV labetalol while she is here.  4.  Type 2 diabetes mellitus.  This is likely diet managed as well or previous history.  We will place her on supplemental coverage with NovoLog while she is here.  5.  DVT prophylaxis.  Medical prophylaxis currently contraindicated due to her GI bleeding.  We will place her on SCDs.  6.  GI prophylaxis.  We will place her for now on Protonix on a prophylactic basis though her source of bleeding is likely of lower GI etiology as mentioned above.  All the records are reviewed and case  discussed with ED provider. Management plans discussed with the patient and her husband and they are in agreement.  CODE STATUS: Full code  TOTAL TIME TAKING CARE OF THIS PATIENT: 55 minutes.    Christel Mormon M.D on 02/14/2019 at 3:31 AM  Pager - [phone]  After 6pm go to [email or web address] - Proofreader  [employer] [facility]  Office  [phone]  CC: Primary care physician; [name], MD   Note: This dictation was prepared with Dragon dictation along with smaller phrase technology. Any transcriptional errors that result from this process are unintentional.

## 2019-02-14 NOTE — ED Notes (Signed)
Pt has returned from medical imaging.   

## 2019-02-14 NOTE — Progress Notes (Signed)
Patient seen this morning.  Patient admitted this morning.  Patient here with bright red blood per rectum x4.  Patient reports 2 more bowel movements with bright red blood per rectum.  Hemoglobin is remained stable.  Patient denies abdominal pain.  Patient with recent colonoscopy at the beginning of March which showed polyps.  Await GI bleeding scan and await GI consultation.  Patient currently n.p.o.

## 2019-02-14 NOTE — Consult Note (Signed)
GI Inpatient Consult Note  Reason for Consult: Rectal Bleeding   Attending Requesting Consult: Dr. Sidney Ace  History of Present Illness: Cheryl Brady is a 58 y.o. female seen for evaluation of GI bleeding at the request of Dr. Sidney Ace. Patient previously had a colonoscopy on 3/06 which showed a 63mm and a 15 mm polyp in the left colon. The 15 mm polyp was removed with a hot snare and surgical clip was placed. Patient initially had loose stools after the colonoscopy, but had regular soft stools after. Patient reports that yesterday at 1030pm, patient went to the bathroom for a BM when she noticed bright red, fresh blood when wiping and in the toilet. Patient subsequently went to the ER for further evaluation. Patient denied any upper GI symptoms. No nausea, vomiting, acid reflux, or regurg. She denied any associated abdominal pain, rectal pain, rectal itching, fever, chills, fatigue, or shortness of breath. Denied any changes in her appetite. Denied taking any blood thinners or NSAIDs.   Upon presentation to the emergency room, her blood pressure was elevated 162/86 with otherwise normal vital signs.  Labs were remarkable for CBC with hemoglobin of 14.3 and hematocrit of 42.8 that dropped to 12.9/38.7 after a bloody bowel movement in the ER.Marland Kitchen  Her potassium was 3.6 and sodium 142 and BUN 11 with creatinine of 0.67 with normal LFTs.  INR 0.9 PT 12.4.  An abdominal and pelvic CT scan without contrast was ordered which revealed no specific sources of active GI bleeding.   Patient is doing well, but continues to have rectal bleeding today. She does note that volume of blood is less compared to yesterday, but color has turned to a dark red maroon color with associated foul odor. She reports three episodes of rectal bleeding this morning. Continues to not complain of any abdominal pain. Does note some fatigue as well as mild nausea. Her Hgb today continues to be stable at 13.5.  Last Colonoscopy: 02/02/2019 Last  Endoscopy: N/A   Past Medical History:  Past Medical History:  Diagnosis Date  . Fibromyalgia 2012   dx by prior PCP  . History of diabetes mellitus 1997-2016   DSME [facility] 03/2013, resolved with 53lb weight loss 2016  . History of iron deficiency anemia 2007  . Hyperlipidemia   . Hypertension   . Hypothyroid   . Migraines    silent migraines - dizzy and nauseated, photophobia  . Perennial allergic rhinitis     Problem List: Patient Active Problem List   Diagnosis Date Noted  . Acute lower GI bleeding 02/14/2019  . OB + stool   . Polyp of sigmoid colon   . Diverticulosis of large intestine without diverticulitis   . Benign neoplasm of ascending colon   . High serum ferritin 10/04/2018  . Floaters, right 12/13/2016  . Breast calcifications on mammogram 11/05/2015  . Rash of face 05/01/2015  . Hip pain, bilateral 09/26/2014  . intermetatarsal bursitis L foot 01/01/2014  . Hyperlipidemia   . Healthcare maintenance 09/21/2012  . Perennial allergic rhinitis   . Migraines   . Hypothyroid   . Fibromyalgia     Past Surgical History: Past Surgical History:  Procedure Laterality Date  . ABDOMINAL HYSTERECTOMY    . CHOLECYSTECTOMY  1989  . COLONOSCOPY WITH PROPOFOL N/A 02/02/2019   TAx2 (one 1.5 cm), diverticulosis, rpt 3 yrs (Tahiliani, Lennette Bihari, MD)  . LAPAROSCOPIC HYSTERECTOMY  2007   total, BSO, for heavy bleeding  . TUBAL LIGATION  Allergies: Allergies  Allergen Reactions  . Amaryl [Glimepiride] Nausea And Vomiting  . Cymbalta [Duloxetine Hcl] Nausea Only  . Januvia [Sitagliptin] Other (See Comments)    Somnolence and GI upset  . Metformin And Related     Nausea and diarrhea - does not want to take  . Adhesive [Tape] Rash  . Latex Rash    Home Medications: Medications Prior to Admission  Medication Sig Dispense Refill Last Dose  . levothyroxine (SYNTHROID, LEVOTHROID) 125 MCG tablet Take 1 tablet (125 mcg total) by mouth daily. 90 tablet 3 02/13/2019 at 0600    Home medication reconciliation was completed with the patient.   Scheduled Inpatient Medications:   . insulin aspart  0-9 Units Subcutaneous Q4H  . levothyroxine  125 mcg Oral QAC breakfast  . pantoprazole (PROTONIX) IV  40 mg Intravenous Q24H  . potassium chloride  40 mEq Oral Once    Continuous Inpatient Infusions:   . sodium chloride 100 mL/hr at 02/14/19 0609    PRN Inpatient Medications:  acetaminophen **OR** acetaminophen, hydrALAZINE, labetalol, ondansetron **OR** ondansetron (ZOFRAN) IV, traZODone  Family History: family history includes ALS in her cousin, father, and paternal aunt; Breast cancer (age of onset: 76) in her paternal aunt; Coronary artery disease in her father; Stroke in her father and mother.  The patient's family history is negative for inflammatory bowel disorders, GI malignancy, or solid organ transplantation.  Social History:   reports that she has quit smoking. She has never used smokeless tobacco. She reports that she does not drink alcohol or use drugs. The patient denies ETOH, tobacco, or drug use.   Review of Systems: Constitutional: Weight is stable.  Eyes: No changes in vision. ENT: No oral lesions, sore throat.  GI: see HPI.  Heme/Lymph: No easy bruising.  CV: No chest pain.  GU: No hematuria.  Integumentary: No rashes.  Neuro: No headaches.  Psych: No depression/anxiety.  Endocrine: No heat/cold intolerance.  Allergic/Immunologic: No urticaria.  Resp: No cough, SOB.  Musculoskeletal: No joint swelling.    Physical Examination: BP 130/76 (BP Location: Left Arm)   Pulse 67   Temp 98.5 F (36.9 C) (Oral)   Resp 16   Ht 5\' 5"  (1.651 m)   Wt 70.3 kg   SpO2 99%   BMI 25.79 kg/m  Gen: NAD, alert and oriented x 4 HEENT: PEERLA, EOMI, Neck: supple, no JVD or thyromegaly Chest: CTA bilaterally, no wheezes, crackles, or other adventitious sounds CV: RRR, no m/g/c/r Abd: soft, NT, ND, +BS in all four quadrants; no HSM, guarding,  ridigity, or rebound tenderness Ext: no edema, well perfused with 2+ pulses, Skin: no rash or lesions noted Lymph: no LAD  Data: Lab Results  Component Value Date   WBC 9.5 02/14/2019   HGB 13.5 02/14/2019   HCT 40.1 02/14/2019   MCV 93.4 02/14/2019   PLT 281 02/14/2019   Recent Labs  Lab 02/14/19 0127 02/14/19 0506 02/14/19 0953  HGB 12.9 14.2 13.5   Lab Results  Component Value Date   NA 142 02/14/2019   K 3.8 02/14/2019   CL 108 02/14/2019   CO2 25 02/14/2019   BUN 8 02/14/2019   CREATININE 0.45 02/14/2019   Lab Results  Component Value Date   ALT 13 02/13/2019   AST 19 02/13/2019   ALKPHOS 69 02/13/2019   BILITOT 0.6 02/13/2019   Recent Labs  Lab 02/13/19 2236  INR 0.9   Assessment/Plan: Cheryl Brady is a 59 y.o. female that presents to the  hospital for GI bleed. Patient continues to be hemodynamically stable with Hgb at 13.5. Despite this, patient continues to complai of rectal bleeding today. Differentials include diverticular bleed or bleed from sight of surgical clip.   Recommendations:  1. Lower GI Bleed:  2. Recent colonoscopy with polypectomy s/p hemoclip - Differential includes diverticular, post polypectomy bleeding, anal outlet, malignancy, AVMs - Recommend luminal evaluation with colonoscopy to rule out lower GI bleeding and treatment if indicated  - Discussed procedure details and indications with pt. We discussed potential risks and complications, including bleeding, infection, small puncture to the intestine or internal organs, or problems with anesthesia. She consents to proceed. - Plan for colonoscopy tomorrow with Dr. Alice Reichert - OK to have clear liquids diet today. NPO after midnight.  - She will complete bowel prep at 1700 today. - Continue to monitor H&H. Transfuse for Hgb <7.0.  Thank you for the consult. Please call with questions or concerns.  Geanie Kenning, PA-C [facility]  [phone]

## 2019-02-14 NOTE — ED Notes (Signed)
ED TO INPATIENT HANDOFF REPORT  ED Nurse Name and Phone #: Anette Guarneri Name/Age/Gender Cheryl Brady 58 y.o. female Room/Bed: ED08A/ED08A  Code Status   Code Status: Full Code  Home/SNF/Other Home Patient oriented to: situation Is this baseline? Yes   Triage Complete: Triage complete  Chief Complaint Bloody Stool   Triage Note Pt in with co rectal bleeding since tonight, states 3/06 had a colonoscopy where they clipped a polyp. States has had 4 bloody stools tonight.    Allergies Allergies  Allergen Reactions  . Amaryl [Glimepiride] Nausea And Vomiting  . Cymbalta [Duloxetine Hcl] Nausea Only  . Januvia [Sitagliptin] Other (See Comments)    Somnolence and GI upset  . Metformin And Related     Nausea and diarrhea - does not want to take  . Adhesive [Tape] Rash  . Latex Rash    Level of Care/Admitting Diagnosis ED Disposition    ED Disposition Condition [facility] Area: [place] [[id number]]  Level of Care: Med-Surg [16]  Diagnosis: Acute lower GI bleeding [427062]  Admitting Physician: Christel Mormon [[id number]]  Attending Physician: Christel Mormon [[id number]]  Estimated length of stay: past midnight tomorrow  Certification:: I certify this patient will need inpatient services for at least 2 midnights  PT Class (Do Not Modify): Inpatient [101]  PT Acc Code (Do Not Modify): Private [1]       B Medical/Surgery History Past Medical History:  Diagnosis Date  . Fibromyalgia 2012   dx by prior PCP  . History of diabetes mellitus 1997-2016   DSME [facility] 03/2013, resolved with 53lb weight loss 2016  . History of iron deficiency anemia 2007  . Hyperlipidemia   . Hypertension   . Hypothyroid   . Migraines    silent migraines - dizzy and nauseated, photophobia  . Perennial allergic rhinitis    Past Surgical History:  Procedure Laterality Date  . ABDOMINAL HYSTERECTOMY    . CHOLECYSTECTOMY  1989  . COLONOSCOPY WITH PROPOFOL N/A  02/02/2019   TAx2 (one 1.5 cm), diverticulosis, rpt 3 yrs (Tahiliani, Lennette Bihari, MD)  . LAPAROSCOPIC HYSTERECTOMY  2007   total, BSO, for heavy bleeding  . TUBAL LIGATION       A IV Location/Drains/Wounds Patient Lines/Drains/Airways Status   Active Line/Drains/Airways    Name:   Placement date:   Placement time:   Site:   Days:   Peripheral IV 02/13/19 Right Antecubital   02/13/19    2242    Antecubital   1          Intake/Output Last 24 hours No intake or output data in the 24 hours ending 02/14/19 0359  Labs/Imaging Results for orders placed or performed during the hospital encounter of 02/13/19 (from the past 48 hour(s))  CBC with Differential     Status: Abnormal   Collection Time: 02/13/19 10:36 PM  Result Value Ref Range   WBC 11.2 (H) 4.0 - 10.5 K/uL   RBC 4.65 3.87 - 5.11 MIL/uL   Hemoglobin 14.3 12.0 - 15.0 g/dL   HCT 42.8 36.0 - 46.0 %   MCV 92.0 80.0 - 100.0 fL   MCH 30.8 26.0 - 34.0 pg   MCHC 33.4 30.0 - 36.0 g/dL   RDW 12.7 11.5 - 15.5 %   Platelets 312 150 - 400 K/uL   nRBC 0.0 0.0 - 0.2 %   Neutrophils Relative % 44 %   Neutro Abs 4.9 1.7 - 7.7 K/uL  Lymphocytes Relative 47 %   Lymphs Abs 5.2 (H) 0.7 - 4.0 K/uL   Monocytes Relative 6 %   Monocytes Absolute 0.7 0.1 - 1.0 K/uL   Eosinophils Relative 3 %   Eosinophils Absolute 0.3 0.0 - 0.5 K/uL   Basophils Relative 0 %   Basophils Absolute 0.0 0.0 - 0.1 K/uL   Immature Granulocytes 0 %   Abs Immature Granulocytes 0.03 0.00 - 0.07 K/uL    Comment: Performed at [facility], [address] [place], [place] [address]  Comprehensive metabolic panel     Status: Abnormal   Collection Time: 02/13/19 10:36 PM  Result Value Ref Range   Sodium 142 135 - 145 mmol/L   Potassium 3.6 3.5 - 5.1 mmol/L   Chloride 106 98 - 111 mmol/L   CO2 25 22 - 32 mmol/L   Glucose, Bld 119 (H) 70 - 99 mg/dL   BUN 11 6 - 20 mg/dL   Creatinine, Ser 0.67 0.44 - 1.00 mg/dL   Calcium 9.7 8.9 - 10.3 mg/dL   Total  Protein 7.4 6.5 - 8.1 g/dL   Albumin 4.6 3.5 - 5.0 g/dL   AST 19 15 - 41 U/L   ALT 13 0 - 44 U/L   Alkaline Phosphatase 69 38 - 126 U/L   Total Bilirubin 0.6 0.3 - 1.2 mg/dL   GFR calc non Af Amer >60 >60 mL/min   GFR calc Af Amer >60 >60 mL/min   Anion gap 11 5 - 15    Comment: Performed at [facility], [address] [place], [place] [address]  Protime-INR     Status: None   Collection Time: 02/13/19 10:36 PM  Result Value Ref Range   Prothrombin Time 12.4 11.4 - 15.2 seconds   INR 0.9 0.8 - 1.2    Comment: (NOTE) INR goal varies based on device and disease states. Performed at [facility], [address] [place], [place] [address]   Hemoglobin and hematocrit, blood     Status: None   Collection Time: 02/14/19  1:27 AM  Result Value Ref Range   Hemoglobin 12.9 12.0 - 15.0 g/dL   HCT 38.7 36.0 - 46.0 %    Comment: Performed at [facility], [address] [place],  [address]   No results found.  Pending Labs Unresulted Labs (From admission, onward)    Start     Ordered   02/14/19 1000  Hemoglobin  Now then every 6 hours,   STAT     02/14/19 0322   02/14/19 1000  Hematocrit  Now then every 6 hours,   STAT     02/14/19 0322   02/14/19 9628  Basic metabolic panel  Tomorrow morning,   STAT     02/14/19 0322   02/14/19 0500  CBC  Tomorrow morning,   STAT     02/14/19 0322   02/14/19 0356  Hemoglobin A1c  Once,   STAT    Comments:  To assess prior glycemic control    02/14/19 0355   02/14/19 0351  TSH  Add-on,   AD     02/14/19 0355   02/14/19 0249  Gastrointestinal Panel by PCR , Stool  (Gastrointestinal Panel by PCR, Stool)  ONCE - STAT,   STAT     02/14/19 0248   02/14/19 0249  C difficile quick scan w PCR reflex  (C Difficile quick screen w PCR reflex panel)  Once, for 24 hours,   STAT  02/14/19 0248          Vitals/Pain Today's Vitals   02/13/19 2227 02/13/19 2228  BP:  (!) 162/86  Pulse:  80  Resp:  20  Temp:   97.7 F (36.5 C)  TempSrc:  Oral  SpO2:  98%  Weight: 70.3 kg   Height: 5\' 5"  (1.651 m)   PainSc: 6      Isolation Precautions Enteric precautions (UV disinfection)  Medications Medications  levothyroxine (SYNTHROID, LEVOTHROID) tablet 125 mcg (has no administration in time range)  0.9 %  sodium chloride infusion (has no administration in time range)  acetaminophen (TYLENOL) tablet 650 mg (has no administration in time range)    Or  acetaminophen (TYLENOL) suppository 650 mg (has no administration in time range)  traZODone (DESYREL) tablet 25 mg (has no administration in time range)  ondansetron (ZOFRAN) tablet 4 mg (has no administration in time range)    Or  ondansetron (ZOFRAN) injection 4 mg (has no administration in time range)  pantoprazole (PROTONIX) injection 40 mg (has no administration in time range)  labetalol (NORMODYNE,TRANDATE) injection 20 mg (has no administration in time range)  insulin aspart (novoLOG) injection 0-9 Units (has no administration in time range)  hydrALAZINE (APRESOLINE) injection 10 mg (has no administration in time range)  iopamidol (ISOVUE-370) 76 % injection 125 mL (125 mLs Intravenous Contrast Given 02/14/19 0330)    Mobility walks Low fall risk   Focused Assessments    R Recommendations: See Admitting Provider Note  Report given to:   Additional Notes:

## 2019-02-14 NOTE — H&P (View-Only) (Signed)
GI Inpatient Consult Note  Reason for Consult: Rectal Bleeding   Attending Requesting Consult: Dr. Sidney Ace  History of Present Illness: Cheryl Brady is a 58 y.o. female seen for evaluation of GI bleeding at the request of Dr. Sidney Ace. Patient previously had a colonoscopy on 3/06 which showed a 97mm and a 15 mm polyp in the left colon. The 15 mm polyp was removed with a hot snare and surgical clip was placed. Patient initially had loose stools after the colonoscopy, but had regular soft stools after. Patient reports that yesterday at 1030pm, patient went to the bathroom for a BM when she noticed bright red, fresh blood when wiping and in the toilet. Patient subsequently went to the ER for further evaluation. Patient denied any upper GI symptoms. No nausea, vomiting, acid reflux, or regurg. She denied any associated abdominal pain, rectal pain, rectal itching, fever, chills, fatigue, or shortness of breath. Denied any changes in her appetite. Denied taking any blood thinners or NSAIDs.   Upon presentation to the emergency room, her blood pressure was elevated 162/86 with otherwise normal vital signs.  Labs were remarkable for CBC with hemoglobin of 14.3 and hematocrit of 42.8 that dropped to 12.9/38.7 after a bloody bowel movement in the ER.Marland Kitchen  Her potassium was 3.6 and sodium 142 and BUN 11 with creatinine of 0.67 with normal LFTs.  INR 0.9 PT 12.4.  An abdominal and pelvic CT scan without contrast was ordered which revealed no specific sources of active GI bleeding.   Patient is doing well, but continues to have rectal bleeding today. She does note that volume of blood is less compared to yesterday, but color has turned to a dark red maroon color with associated foul odor. She reports three episodes of rectal bleeding this morning. Continues to not complain of any abdominal pain. Does note some fatigue as well as mild nausea. Her Hgb today continues to be stable at 13.5.  Last Colonoscopy: 02/02/2019 Last  Endoscopy: N/A   Past Medical History:  Past Medical History:  Diagnosis Date  . Fibromyalgia 2012   dx by prior PCP  . History of diabetes mellitus 1997-2016   DSME [facility] 03/2013, resolved with 53lb weight loss 2016  . History of iron deficiency anemia 2007  . Hyperlipidemia   . Hypertension   . Hypothyroid   . Migraines    silent migraines - dizzy and nauseated, photophobia  . Perennial allergic rhinitis     Problem List: Patient Active Problem List   Diagnosis Date Noted  . Acute lower GI bleeding 02/14/2019  . OB + stool   . Polyp of sigmoid colon   . Diverticulosis of large intestine without diverticulitis   . Benign neoplasm of ascending colon   . High serum ferritin 10/04/2018  . Floaters, right 12/13/2016  . Breast calcifications on mammogram 11/05/2015  . Rash of face 05/01/2015  . Hip pain, bilateral 09/26/2014  . intermetatarsal bursitis L foot 01/01/2014  . Hyperlipidemia   . Healthcare maintenance 09/21/2012  . Perennial allergic rhinitis   . Migraines   . Hypothyroid   . Fibromyalgia     Past Surgical History: Past Surgical History:  Procedure Laterality Date  . ABDOMINAL HYSTERECTOMY    . CHOLECYSTECTOMY  1989  . COLONOSCOPY WITH PROPOFOL N/A 02/02/2019   TAx2 (one 1.5 cm), diverticulosis, rpt 3 yrs (Tahiliani, Lennette Bihari, MD)  . LAPAROSCOPIC HYSTERECTOMY  2007   total, BSO, for heavy bleeding  . TUBAL LIGATION  Allergies: Allergies  Allergen Reactions  . Amaryl [Glimepiride] Nausea And Vomiting  . Cymbalta [Duloxetine Hcl] Nausea Only  . Januvia [Sitagliptin] Other (See Comments)    Somnolence and GI upset  . Metformin And Related     Nausea and diarrhea - does not want to take  . Adhesive [Tape] Rash  . Latex Rash    Home Medications: Medications Prior to Admission  Medication Sig Dispense Refill Last Dose  . levothyroxine (SYNTHROID, LEVOTHROID) 125 MCG tablet Take 1 tablet (125 mcg total) by mouth daily. 90 tablet 3 02/13/2019 at 0600    Home medication reconciliation was completed with the patient.   Scheduled Inpatient Medications:   . insulin aspart  0-9 Units Subcutaneous Q4H  . levothyroxine  125 mcg Oral QAC breakfast  . pantoprazole (PROTONIX) IV  40 mg Intravenous Q24H  . potassium chloride  40 mEq Oral Once    Continuous Inpatient Infusions:   . sodium chloride 100 mL/hr at 02/14/19 0609    PRN Inpatient Medications:  acetaminophen **OR** acetaminophen, hydrALAZINE, labetalol, ondansetron **OR** ondansetron (ZOFRAN) IV, traZODone  Family History: family history includes ALS in her cousin, father, and paternal aunt; Breast cancer (age of onset: 26) in her paternal aunt; Coronary artery disease in her father; Stroke in her father and mother.  The patient's family history is negative for inflammatory bowel disorders, GI malignancy, or solid organ transplantation.  Social History:   reports that she has quit smoking. She has never used smokeless tobacco. She reports that she does not drink alcohol or use drugs. The patient denies ETOH, tobacco, or drug use.   Review of Systems: Constitutional: Weight is stable.  Eyes: No changes in vision. ENT: No oral lesions, sore throat.  GI: see HPI.  Heme/Lymph: No easy bruising.  CV: No chest pain.  GU: No hematuria.  Integumentary: No rashes.  Neuro: No headaches.  Psych: No depression/anxiety.  Endocrine: No heat/cold intolerance.  Allergic/Immunologic: No urticaria.  Resp: No cough, SOB.  Musculoskeletal: No joint swelling.    Physical Examination: BP 130/76 (BP Location: Left Arm)   Pulse 67   Temp 98.5 F (36.9 C) (Oral)   Resp 16   Ht 5\' 5"  (1.651 m)   Wt 70.3 kg   SpO2 99%   BMI 25.79 kg/m  Gen: NAD, alert and oriented x 4 HEENT: PEERLA, EOMI, Neck: supple, no JVD or thyromegaly Chest: CTA bilaterally, no wheezes, crackles, or other adventitious sounds CV: RRR, no m/g/c/r Abd: soft, NT, ND, +BS in all four quadrants; no HSM, guarding,  ridigity, or rebound tenderness Ext: no edema, well perfused with 2+ pulses, Skin: no rash or lesions noted Lymph: no LAD  Data: Lab Results  Component Value Date   WBC 9.5 02/14/2019   HGB 13.5 02/14/2019   HCT 40.1 02/14/2019   MCV 93.4 02/14/2019   PLT 281 02/14/2019   Recent Labs  Lab 02/14/19 0127 02/14/19 0506 02/14/19 0953  HGB 12.9 14.2 13.5   Lab Results  Component Value Date   NA 142 02/14/2019   K 3.8 02/14/2019   CL 108 02/14/2019   CO2 25 02/14/2019   BUN 8 02/14/2019   CREATININE 0.45 02/14/2019   Lab Results  Component Value Date   ALT 13 02/13/2019   AST 19 02/13/2019   ALKPHOS 69 02/13/2019   BILITOT 0.6 02/13/2019   Recent Labs  Lab 02/13/19 2236  INR 0.9   Assessment/Plan: Ms. Feldkamp is a 58 y.o. female that presents to the  hospital for GI bleed. Patient continues to be hemodynamically stable with Hgb at 13.5. Despite this, patient continues to complai of rectal bleeding today. Differentials include diverticular bleed or bleed from sight of surgical clip.   Recommendations:  1. Lower GI Bleed:  2. Recent colonoscopy with polypectomy s/p hemoclip - Differential includes diverticular, post polypectomy bleeding, anal outlet, malignancy, AVMs - Recommend luminal evaluation with colonoscopy to rule out lower GI bleeding and treatment if indicated  - Discussed procedure details and indications with pt. We discussed potential risks and complications, including bleeding, infection, small puncture to the intestine or internal organs, or problems with anesthesia. She consents to proceed. - Plan for colonoscopy tomorrow with Dr. Alice Reichert - OK to have clear liquids diet today. NPO after midnight.  - She will complete bowel prep at 1700 today. - Continue to monitor H&H. Transfuse for Hgb <7.0.  Thank you for the consult. Please call with questions or concerns.  Geanie Kenning, PA-C [facility]  [phone]

## 2019-02-14 NOTE — ED Notes (Signed)
Pt is going to medical imaging.   

## 2019-02-15 ENCOUNTER — Encounter
Admission: EM | Disposition: A | Discharge status: Home / Self Care | Payer: Self-pay | Source: Home / Self Care | Attending: Emergency Medicine

## 2019-02-15 ENCOUNTER — Inpatient Hospital Stay: Admitting: Anesthesiology

## 2019-02-15 ENCOUNTER — Telehealth: Payer: Self-pay | Admitting: Gastroenterology

## 2019-02-15 ENCOUNTER — Other Ambulatory Visit

## 2019-02-15 ENCOUNTER — Encounter: Payer: Self-pay | Admitting: *Deleted

## 2019-02-15 HISTORY — PX: COLONOSCOPY: SHX5424

## 2019-02-15 LAB — CBC
HCT: 35.5 % — ABNORMAL LOW (ref 36.0–46.0)
Hemoglobin: 11.7 g/dL — ABNORMAL LOW (ref 12.0–15.0)
MCH: 30.4 pg (ref 26.0–34.0)
MCHC: 33 g/dL (ref 30.0–36.0)
MCV: 92.2 fL (ref 80.0–100.0)
PLATELETS: 236 10*3/uL (ref 150–400)
RBC: 3.85 MIL/uL — ABNORMAL LOW (ref 3.87–5.11)
RDW: 12.9 % (ref 11.5–15.5)
WBC: 8.2 10*3/uL (ref 4.0–10.5)
nRBC: 0 % (ref 0.0–0.2)

## 2019-02-15 LAB — GLUCOSE, CAPILLARY
Glucose-Capillary: 92 mg/dL (ref 70–99)
Glucose-Capillary: 98 mg/dL (ref 70–99)

## 2019-02-15 SURGERY — COLONOSCOPY
Anesthesia: General

## 2019-02-15 MED ORDER — PROPOFOL 500 MG/50ML IV EMUL
INTRAVENOUS | Status: DC | PRN
  Administered 2019-02-15: 120 ug/kg/min via INTRAVENOUS

## 2019-02-15 MED ORDER — EPINEPHRINE PF 1 MG/ML IJ SOLN
INTRAMUSCULAR | Status: DC | PRN
  Administered 2019-02-15: 1 mL via SUBCUTANEOUS

## 2019-02-15 MED ORDER — EPINEPHRINE PF 1 MG/10ML IJ SOSY
PREFILLED_SYRINGE | INTRAMUSCULAR | Status: AC
  Filled 2019-02-15: qty 10

## 2019-02-15 MED ORDER — PROPOFOL 10 MG/ML IV BOLUS
INTRAVENOUS | Status: AC
  Filled 2019-02-15: qty 40

## 2019-02-15 MED ORDER — PROPOFOL 10 MG/ML IV BOLUS
INTRAVENOUS | Status: DC | PRN
  Administered 2019-02-15: 80 mg via INTRAVENOUS
  Administered 2019-02-15: 30 mg via INTRAVENOUS

## 2019-02-15 MED ORDER — SODIUM CHLORIDE 0.9 % IV SOLN: INTRAVENOUS | Status: DC

## 2019-02-15 NOTE — Brief Op Note (Deleted)
Fine needle biopsy collected by [employer] at bedside.

## 2019-02-15 NOTE — Anesthesia Postprocedure Evaluation (Signed)
Anesthesia Post Note  Patient: Cheryl Brady  Procedure(s) Performed: COLONOSCOPY (N/A )  Patient location during evaluation: Endoscopy Anesthesia Type: General Level of consciousness: awake and alert Pain management: pain level controlled Vital Signs Assessment: post-procedure vital signs reviewed and stable Respiratory status: spontaneous breathing, nonlabored ventilation, respiratory function stable and patient connected to nasal cannula oxygen Cardiovascular status: blood pressure returned to baseline and stable Postop Assessment: no apparent nausea or vomiting Anesthetic complications: no     Last Vitals:  Vitals:   02/15/19 0853 02/15/19 1003  BP: 135/84 (!) 104/59  Pulse: 75 98  Resp: 20 15  Temp: 36.5 C (!) 36.2 C  SpO2: 100% 98%    Last Pain:  Vitals:   02/15/19 1023  TempSrc:   PainSc: 0-No pain                 [name] S

## 2019-02-15 NOTE — Discharge Summary (Signed)
[facility] [place] NAME: Cheryl Brady    MR#:  [id number]  DATE OF BIRTH:  06/04/1961  DATE OF ADMISSION:  02/13/2019 ADMITTING PHYSICIAN: Arta Silence, MD  DATE OF DISCHARGE: 02/15/2019  PRIMARY CARE PHYSICIAN: [name], MD    ADMISSION DIAGNOSIS:  Bloody Stool   DISCHARGE DIAGNOSIS:  Active Problems:   Acute lower GI bleeding   SECONDARY DIAGNOSIS:   Past Medical History:  Diagnosis Date  . Fibromyalgia 2012   dx by prior PCP  . History of diabetes mellitus 1997-2016   DSME [facility] 03/2013, resolved with 53lb weight loss 2016  . History of iron deficiency anemia 2007  . Hyperlipidemia   . Hypertension   . Hypothyroid   . Migraines    silent migraines - dizzy and nauseated, photophobia  . Perennial allergic rhinitis     HOSPITAL COURSE:    59 year old female with history of hypothyroidism who underwent colonoscopy at the beginning of March who presented with bright red blood per rectum.  1.  Bright red blood per rectum: Patient's hemoglobin remained stable.  She was evaluated GI.  She underwent colonoscopy on March 19. Solitary ulcer in the distal sigmoid colon was injected and treated with bipolar cautery.  There were nonbleeding internal hemorrhoids.  Examination was otherwise normal.  As per GI recommendations okay for patient to be discharged.  Activity as tolerated.  Patient will follow-up with GI in 1 to 2 weeks.  2.  Hypothyroidism: Patient may resume Synthroid 3  Diet-controlled diabetes: Continue ADA diet DISCHARGE CONDITIONS AND DIET:   Stable for discharge on diabetic diet  CONSULTS OBTAINED:  Treatment Team:  Efrain Sella, MD  DRUG ALLERGIES:   Allergies  Allergen Reactions  . Amaryl [Glimepiride] Nausea And Vomiting  . Cymbalta [Duloxetine Hcl] Nausea Only  . Januvia [Sitagliptin] Other (See Comments)    Somnolence and GI upset  . Metformin And Related     Nausea and diarrhea -  does not want to take  . Adhesive [Tape] Rash  . Latex Rash    DISCHARGE MEDICATIONS:   Allergies as of 02/15/2019      Reactions   Amaryl [glimepiride] Nausea And Vomiting   Cymbalta [duloxetine Hcl] Nausea Only   Januvia [sitagliptin] Other (See Comments)   Somnolence and GI upset   Metformin And Related    Nausea and diarrhea - does not want to take   Adhesive [tape] Rash   Latex Rash      Medication List    TAKE these medications   levothyroxine 125 MCG tablet Commonly known as:  SYNTHROID, LEVOTHROID Take 1 tablet (125 mcg total) by mouth daily.         Today   CHIEF COMPLAINT:   Patient underwent colonoscopy.  No further bleeding.   VITAL SIGNS:  Blood pressure (!) 104/59, pulse 98, temperature (!) 97.2 F (36.2 C), temperature source Tympanic, resp. rate 15, height 5\' 5"  (1.651 m), weight 70.3 kg, SpO2 98 %.   REVIEW OF SYSTEMS:  Review of Systems  Constitutional: Negative.  Negative for chills, fever and malaise/fatigue.  HENT: Negative.  Negative for ear discharge, ear pain, hearing loss, nosebleeds and sore throat.   Eyes: Negative.  Negative for blurred vision and pain.  Respiratory: Negative.  Negative for cough, hemoptysis, shortness of breath and wheezing.   Cardiovascular: Negative.  Negative for chest pain, palpitations and leg swelling.  Gastrointestinal: Negative.  Negative for abdominal pain, blood in stool,  diarrhea, nausea and vomiting.  Genitourinary: Negative.  Negative for dysuria.  Musculoskeletal: Negative.  Negative for back pain.  Skin: Negative.   Neurological: Negative for dizziness, tremors, speech change, focal weakness, seizures and headaches.  Endo/Heme/Allergies: Negative.  Does not bruise/bleed easily.  Psychiatric/Behavioral: Negative.  Negative for depression, hallucinations and suicidal ideas.     PHYSICAL EXAMINATION:  GENERAL:  58 y.o.-year-old patient lying in the bed with no acute distress.  NECK:  Supple, no  jugular venous distention. No thyroid enlargement, no tenderness.  LUNGS: Normal breath sounds bilaterally, no wheezing, rales,rhonchi  No use of accessory muscles of respiration.  CARDIOVASCULAR: S1, S2 normal. No murmurs, rubs, or gallops.  ABDOMEN: Soft, non-tender, non-distended. Bowel sounds present. No organomegaly or mass.  EXTREMITIES: No pedal edema, cyanosis, or clubbing.  PSYCHIATRIC: The patient is alert and oriented x 3.  SKIN: No obvious rash, lesion, or ulcer.   DATA REVIEW:   CBC Recent Labs  Lab 02/15/19 0449  WBC 8.2  HGB 11.7*  HCT 35.5*  PLT 236    Chemistries  Recent Labs  Lab 02/13/19 2236 02/14/19 0506  NA 142 142  K 3.6 3.8  CL 106 108  CO2 25 25  GLUCOSE 119* 110*  BUN 11 8  CREATININE 0.67 0.45  CALCIUM 9.7 9.4  AST 19  --   ALT 13  --   ALKPHOS 69  --   BILITOT 0.6  --     Cardiac Enzymes No results for input(s): TROPONINI in the last 168 hours.  Microbiology Results  @[email or web address] @  RADIOLOGY:  Ct Angio Abd/pel W/ And/or W/o  Result Date: 02/14/2019 CLINICAL DATA:  GI bleeding.  Colonic polyps removed 2 weeks ago EXAM: CTA ABDOMEN AND PELVIS wITHOUT AND WITH CONTRAST TECHNIQUE: Multidetector CT imaging of the abdomen and pelvis was performed using the standard protocol during bolus administration of intravenous contrast. Multiplanar reconstructed images and MIPs were obtained and reviewed to evaluate the vascular anatomy. CONTRAST:  198mL ISOVUE-370 IOPAMIDOL (ISOVUE-370) INJECTION 76% COMPARISON:  None. FINDINGS: VASCULAR Aorta: Atheromatous wall thickening.  No dissection or dilatation Celiac: Smooth and widely patent SMA: Smooth and widely patent Renals: Single bilateral renal arteries that are smooth and widely patent IMA: Patent Inflow: Atherosclerosis without stenosis or acute finding Proximal Outflow: Widely patent on both sides Veins: Negative Review of the MIP images confirms the above findings. NON-VASCULAR Lower chest: Negative  Hepatobiliary: No focal liver abnormality is seen. Status post cholecystectomy. No biliary dilatation. Pancreas: Negative Spleen: Negative Adrenals/Urinary Tract: Adrenal glands are unremarkable. Kidneys are normal, without renal calculi, solid lesion, or hydronephrosis. Bladder is unremarkable. Stomach/Bowel: Mild left-sided diverticulosis. A clip is seen at the level of the sigmoid colon with no evidence of active luminal hemorrhage. No evidence of bowel inflammation. No diverticulitis. Lymphatic: Negative for adenopathy Reproductive: Hysterectomy Other: Shallow fatty bilateral inguinal hernia. Musculoskeletal: No acute or aggressive finding IMPRESSION: VASCULAR 1. No emergent finding.  No specific source of active GI bleeding. 2. Atherosclerosis. NON-VASCULAR Colonic diverticulosis. A luminal clip is seen at the level of the sigmoid colon. Electronically Signed   By: Monte Fantasia M.D.   On: 02/14/2019 04:23      Allergies as of 02/15/2019      Reactions   Amaryl [glimepiride] Nausea And Vomiting   Cymbalta [duloxetine Hcl] Nausea Only   Januvia [sitagliptin] Other (See Comments)   Somnolence and GI upset   Metformin And Related    Nausea and diarrhea - does not want to take  Adhesive [tape] Rash   Latex Rash      Medication List    TAKE these medications   levothyroxine 125 MCG tablet Commonly known as:  SYNTHROID, LEVOTHROID Take 1 tablet (125 mcg total) by mouth daily.         Management plans discussed with the patient and she is in agreement. Stable for discharge home  Patient should follow up with GI  CODE STATUS:     Code Status Orders  (From admission, onward)         Start     Ordered   02/14/19 0302  Full code  Continuous     02/14/19 0322        Code Status History    This patient has a current code status but no historical code status.      TOTAL TIME TAKING CARE OF THIS PATIENT: 38 minutes.    Note: This dictation was prepared with Dragon  dictation along with smaller phrase technology. Any transcriptional errors that result from this process are unintentional.  Bettey Costa M.D on 02/15/2019 at 10:24 AM  Between 7am to 6pm - Pager - [phone] After 6pm go to [email or web address] - password [email or web address] [facility]  Office  [phone]  CC: Primary care physician; [name], MD

## 2019-02-15 NOTE — Progress Notes (Signed)
Patient cleared for discharge to        Education complete. AVS printed. Discharge instructions given. All questions answered for patient clarification.  No change in meds. IV removed.  Discharged to home via POV

## 2019-02-15 NOTE — Anesthesia Preprocedure Evaluation (Signed)
Anesthesia Evaluation  Patient identified by MRN, date of birth, ID band Patient awake    Reviewed: Allergy & Precautions, NPO status , Patient's Chart, lab work & pertinent test results, reviewed documented beta blocker date and time   Airway Mallampati: III  TM Distance: >3 FB     Dental  (+) Chipped   Pulmonary former smoker,           Cardiovascular hypertension, Pt. on medications      Neuro/Psych  Headaches,  Neuromuscular disease    GI/Hepatic   Endo/Other  Hypothyroidism   Renal/GU      Musculoskeletal  (+) Fibromyalgia -  Abdominal   Peds  Hematology   Anesthesia Other Findings   Reproductive/Obstetrics                             Anesthesia Physical Anesthesia Plan  ASA: II  Anesthesia Plan: General   Post-op Pain Management:    Induction: Intravenous  PONV Risk Score and Plan:   Airway Management Planned:   Additional Equipment:   Intra-op Plan:   Post-operative Plan:   Informed Consent: I have reviewed the patients History and Physical, chart, labs and discussed the procedure including the risks, benefits and alternatives for the proposed anesthesia with the patient or authorized representative who has indicated his/her understanding and acceptance.       Plan Discussed with: CRNA  Anesthesia Plan Comments:         Anesthesia Quick Evaluation

## 2019-02-15 NOTE — Transfer of Care (Signed)
Immediate Anesthesia Transfer of Care Note  Patient: Cheryl Brady  Procedure(s) Performed: COLONOSCOPY (N/A )  Patient Location: Endoscopy Unit  Anesthesia Type:General  Level of Consciousness: drowsy and patient cooperative  Airway & Oxygen Therapy: Patient Spontanous Breathing and Patient connected to nasal cannula oxygen  Post-op Assessment: Report given to RN and Post -op Vital signs reviewed and stable  Post vital signs: Reviewed and stable  Last Vitals:  Vitals Value Taken Time  BP 104/59 02/15/2019 10:04 AM  Temp 36.2 C 02/15/2019 10:03 AM  Pulse 74 02/15/2019 10:05 AM  Resp 13 02/15/2019 10:05 AM  SpO2 98 % 02/15/2019 10:05 AM  Vitals shown include unvalidated device data.  Last Pain:  Vitals:   02/15/19 1003  TempSrc: Tympanic  PainSc: 0-No pain         Complications: No apparent anesthesia complications

## 2019-02-15 NOTE — Op Note (Signed)
[facility] Patient Name: Cheryl Brady Procedure Date: 02/15/2019 9:26 AM MRN: [id number] Account #: [id number] Date of Birth: 26-May-1961 Admit Type: Inpatient Age: 58 Room: [facility] ENDO ROOM 3 Gender: Female Note Status: Finalized Procedure:            Colonoscopy Indications:          Hematochezia, had recent colonoscopy with polypectomy Providers:            Benay Pike. Alice Reichert MD, MD Referring MD:         [name] (Referring MD) Medicines:            Propofol per Anesthesia Complications:        No immediate complications. Procedure:            Pre-Anesthesia Assessment:                       - The risks and benefits of the procedure and the                        sedation options and risks were discussed with the                        patient. All questions were answered and informed                        consent was obtained.                       - Patient identification and proposed procedure were                        verified prior to the procedure by the nurse. The                        procedure was verified in the procedure room.                       - ASA Grade Assessment: III - A patient with severe                        systemic disease.                       - After reviewing the risks and benefits, the patient                        was deemed in satisfactory condition to undergo the                        procedure.                       After obtaining informed consent, the colonoscope was                        passed under direct vision. Throughout the procedure,                        the patient's blood pressure, pulse, and oxygen  saturations were monitored continuously. The                        Colonoscope was introduced through the anus and                        advanced to the the cecum, identified by appendiceal                        orifice and ileocecal valve. The colonoscopy was                         performed without difficulty. The patient tolerated the                        procedure well. The quality of the bowel preparation                        was excellent. The ileocecal valve, appendiceal                        orifice, and rectum were photographed. Findings:      The perianal and digital rectal examinations were normal. Pertinent       negatives include normal sphincter tone and no palpable rectal lesions.      Multiple small and large-mouthed diverticula were found in the sigmoid       colon.      A single (solitary) seven mm ulcer was found in the distal sigmoid       colon. No bleeding was present. Non-bleeding visible vessel was noted in       this area of previous polypectomy. Area was successfully injected with 1       mL of a 1:10,000 solution of epinephrine for hemostasis. Coagulation for       hemostasis using bipolar probe was successful. Estimated blood loss:       none.      Non-bleeding internal hemorrhoids were found during retroflexion. The       hemorrhoids were Grade I (internal hemorrhoids that do not prolapse).      The exam was otherwise without abnormality.      Noted is that the area of the ulcer with visible vessel was in the       postpolypectomy site with an adjacent hemoclip attached. Impression:           - Diverticulosis in the sigmoid colon.                       - A single (solitary) ulcer in the distal sigmoid                        colon. Injected. Treated with bipolar cautery.                       - Non-bleeding internal hemorrhoids.                       - The examination was otherwise normal.                       - No specimens collected. Recommendation:       - Return patient to hospital ward  for possible                        discharge same day.                       - Advance diet as tolerated.                       - Continue present medications.                       - Return to Dr. Bonna Gains in 2  weeks.                       - The findings and recommendations were discussed with                        the patient and their spouse. Procedure Code(s):    --- Professional ---                       [phone], Colonoscopy, flexible; with control of bleeding,                        any method Diagnosis Code(s):    --- Professional ---                       K64.0, First degree hemorrhoids                       K63.3, Ulcer of intestine                       K92.1, Melena (includes Hematochezia)                       K57.30, Diverticulosis of large intestine without                        perforation or abscess without bleeding CPT copyright 2018 American Medical Association. All rights reserved. The codes documented in this report are preliminary and upon coder review may  be revised to meet current compliance requirements. Efrain Sella MD, MD 02/15/2019 10:09:58 AM This report has been signed electronically. Number of Addenda: 0 Note Initiated On: 02/15/2019 9:26 AM Scope Withdrawal Time: 0 hours 11 minutes 13 seconds  Total Procedure Duration: 0 hours 14 minutes 37 seconds       [facility]

## 2019-02-15 NOTE — Telephone Encounter (Signed)
[place]  Calling to schedule 1 week f/u for pt when can we schedule this pt for?

## 2019-02-15 NOTE — Interval H&P Note (Signed)
History and Physical Interval Note:  02/15/2019 9:31 AM  Cheryl Brady  has presented today for surgery, with the diagnosis of Hematochezia.  The various methods of treatment have been discussed with the patient and family. After consideration of risks, benefits and other options for treatment, the patient has consented to  Procedure(s): COLONOSCOPY (N/A) as a surgical intervention.  The patient's history has been reviewed, patient examined, no change in status, stable for surgery.  I have reviewed the patient's chart and labs.  Questions were answered to the patient's satisfaction.     [place], [place]

## 2019-02-15 NOTE — Anesthesia Post-op Follow-up Note (Signed)
Anesthesia QCDR form completed.        

## 2019-02-23 ENCOUNTER — Encounter: Payer: Self-pay | Admitting: Family Medicine

## 2019-02-26 NOTE — Telephone Encounter (Signed)
Spoke with pt to schedule Ed f/u apt with Dr. Bonna Gains pt states her schedule Is crazy right now and she will call us back

## 2019-12-16 ENCOUNTER — Other Ambulatory Visit: Payer: Self-pay | Admitting: Family Medicine

## 2019-12-16 DIAGNOSIS — E785 Hyperlipidemia, unspecified: Secondary | ICD-10-CM

## 2019-12-16 DIAGNOSIS — E039 Hypothyroidism, unspecified: Secondary | ICD-10-CM

## 2019-12-16 DIAGNOSIS — R7989 Other specified abnormal findings of blood chemistry: Secondary | ICD-10-CM

## 2019-12-17 ENCOUNTER — Other Ambulatory Visit

## 2019-12-21 ENCOUNTER — Encounter: Admitting: Family Medicine

## 2020-01-11 ENCOUNTER — Ambulatory Visit: Admitting: Family Medicine

## 2020-01-28 ENCOUNTER — Telehealth: Payer: Self-pay

## 2020-01-28 NOTE — Telephone Encounter (Signed)
LVM w COVID screen, front door and back lab info 3.1.2021 TLJ

## 2020-01-30 ENCOUNTER — Other Ambulatory Visit

## 2020-02-01 ENCOUNTER — Encounter: Admitting: Family Medicine

## 2020-02-04 ENCOUNTER — Other Ambulatory Visit: Payer: Self-pay

## 2020-02-04 ENCOUNTER — Other Ambulatory Visit (INDEPENDENT_AMBULATORY_CARE_PROVIDER_SITE_OTHER)

## 2020-02-04 DIAGNOSIS — E785 Hyperlipidemia, unspecified: Secondary | ICD-10-CM

## 2020-02-04 DIAGNOSIS — E039 Hypothyroidism, unspecified: Secondary | ICD-10-CM

## 2020-02-04 DIAGNOSIS — R7989 Other specified abnormal findings of blood chemistry: Secondary | ICD-10-CM

## 2020-02-04 LAB — LIPID PANEL
Cholesterol: 253 mg/dL — ABNORMAL HIGH (ref 0–200)
HDL: 55.9 mg/dL (ref >39.00)
LDL Cholesterol: 169 mg/dL — ABNORMAL HIGH (ref 0–99)
NonHDL: 197.16
Total CHOL/HDL Ratio: 5
Triglycerides: 140 mg/dL (ref 0.0–149.0)
VLDL: 28 mg/dL (ref 0.0–40.0)

## 2020-02-04 LAB — COMPREHENSIVE METABOLIC PANEL
ALT: 12 U/L (ref 0–35)
AST: 15 U/L (ref 0–37)
Albumin: 4.3 g/dL (ref 3.5–5.2)
Alkaline Phosphatase: 72 U/L (ref 39–117)
BUN: 9 mg/dL (ref 6–23)
CO2: 29 mEq/L (ref 19–32)
Calcium: 9.6 mg/dL (ref 8.4–10.5)
Chloride: 105 mEq/L (ref 96–112)
Creatinine, Ser: 0.65 mg/dL (ref 0.40–1.20)
GFR: 93.7 mL/min (ref >60.00)
Glucose, Bld: 95 mg/dL (ref 70–99)
Potassium: 4.9 mEq/L (ref 3.5–5.1)
Sodium: 139 mEq/L (ref 135–145)
Total Bilirubin: 0.5 mg/dL (ref 0.2–1.2)
Total Protein: 7.4 g/dL (ref 6.0–8.3)

## 2020-02-04 LAB — CBC WITH DIFFERENTIAL/PLATELET
Basophils Absolute: 0 10*3/uL (ref 0.0–0.1)
Basophils Relative: 0.5 % (ref 0.0–3.0)
Eosinophils Absolute: 0.2 10*3/uL (ref 0.0–0.7)
Eosinophils Relative: 2.6 % (ref 0.0–5.0)
HCT: 43.7 % (ref 36.0–46.0)
Hemoglobin: 14.9 g/dL (ref 12.0–15.0)
Lymphocytes Relative: 32.5 % (ref 12.0–46.0)
Lymphs Abs: 2.7 10*3/uL (ref 0.7–4.0)
MCHC: 34 g/dL (ref 30.0–36.0)
MCV: 92.2 fl (ref 78.0–100.0)
Monocytes Absolute: 0.6 10*3/uL (ref 0.1–1.0)
Monocytes Relative: 7.8 % (ref 3.0–12.0)
Neutro Abs: 4.7 10*3/uL (ref 1.4–7.7)
Neutrophils Relative %: 56.6 % (ref 43.0–77.0)
Platelets: 296 10*3/uL (ref 150.0–400.0)
RBC: 4.75 Mil/uL (ref 3.87–5.11)
RDW: 13.5 % (ref 11.5–15.5)
WBC: 8.3 10*3/uL (ref 4.0–10.5)

## 2020-02-04 LAB — TSH: TSH: 4.29 u[IU]/mL (ref 0.35–4.50)

## 2020-02-04 LAB — FERRITIN: Ferritin: 111.9 ng/mL (ref 10.0–291.0)

## 2020-02-12 ENCOUNTER — Other Ambulatory Visit: Payer: Self-pay

## 2020-02-12 ENCOUNTER — Encounter: Payer: Self-pay | Admitting: Family Medicine

## 2020-02-12 ENCOUNTER — Ambulatory Visit (INDEPENDENT_AMBULATORY_CARE_PROVIDER_SITE_OTHER): Admitting: Family Medicine

## 2020-02-12 VITALS — BP 136/84 | HR 73 | Temp 97.7°F | Ht 65.5 in | Wt 174.1 lb

## 2020-02-12 DIAGNOSIS — E7800 Pure hypercholesterolemia, unspecified: Secondary | ICD-10-CM | POA: Diagnosis not present

## 2020-02-12 DIAGNOSIS — Z0000 Encounter for general adult medical examination without abnormal findings: Secondary | ICD-10-CM | POA: Diagnosis not present

## 2020-02-12 DIAGNOSIS — E039 Hypothyroidism, unspecified: Secondary | ICD-10-CM

## 2020-02-12 DIAGNOSIS — R7989 Other specified abnormal findings of blood chemistry: Secondary | ICD-10-CM | POA: Diagnosis not present

## 2020-02-12 DIAGNOSIS — E663 Overweight: Secondary | ICD-10-CM

## 2020-02-12 MED ORDER — LEVOTHYROXINE SODIUM 137 MCG PO TABS: 137.0000 ug | ORAL_TABLET | Freq: Every day | ORAL | 3 refills | Status: DC

## 2020-02-12 MED ORDER — ATORVASTATIN CALCIUM 20 MG PO TABS: 20.0000 mg | ORAL_TABLET | Freq: Every day | ORAL | 3 refills | Status: DC

## 2020-02-12 MED ORDER — ASCORBIC ACID 500 MG PO TABS: 500.0000 mg | ORAL_TABLET | Freq: Every day | ORAL | Status: AC

## 2020-02-12 MED ORDER — MULTIVITAMIN WOMEN PO TABS: 1.0000 | ORAL_TABLET | Freq: Every day | ORAL | Status: AC

## 2020-02-12 NOTE — Progress Notes (Signed)
This visit was conducted in person.  BP 136/84 (BP Location: Right Arm, Patient Position: Sitting, Cuff Size: Large)   Pulse 73   Temp 97.7 F (36.5 C) (Temporal)   Ht 5' 5.5" (1.664 m)   Wt 174 lb 1 oz (79 kg)   SpO2 97%   BMI 28.52 kg/m    CC: CPE Subjective:    Patient ID: Cheryl Brady, female    DOB: 1961-01-11, 59 y.o.   MRN: [id number]  HPI: Cheryl Brady is a 59 y.o. female presenting on 02/12/2020 for Annual Exam   Preventative: COLONOSCOPY WITH PROPOFOL 02/02/2019 - TAx2 (one 1.5 cm), diverticulosis, rpt 3 yrs Cheryl Brady, [place] B, MD) --> complicated by lower GI bleed  COLONOSCOPY 02/15/2019 - done for hematochezia - diverticulosis, distal sigmoid colon ulcer injected and treated with bipolar cautery Alice Reichert, Benay Pike, MD) Well woman - Dr Hulan Fray 06/2015, normal exam/pap.Due for f/u. Mammogram - 11/2018 [facility] Lung cancer screening - not eligible Declines flu shot. Declines pneumovax. COVID - interested Tdap 2013. shingrix - declines Seatbelt use discussed. Sunscreen use discussed. No changing moles on skin.  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 Interim medical history since our last visit reviewed. Allergies and medications reviewed and updated. Outpatient Medications Prior to Visit  Medication Sig Dispense Refill  . levothyroxine (SYNTHROID, LEVOTHROID) 125 MCG tablet Take 1 tablet (125 mcg total) by mouth daily. 90 tablet 3   No facility-administered medications prior to visit.     Per HPI unless specifically indicated in ROS section below Review of Systems  Constitutional: Negative for activity change, appetite change, chills,  fatigue, fever and unexpected weight change.  HENT: Negative for hearing loss.   Eyes: Negative for visual disturbance.  Respiratory: Negative for cough, chest tightness, shortness of breath and wheezing.   Cardiovascular: Negative for chest pain, palpitations and leg swelling.  Gastrointestinal: Negative for abdominal distention, abdominal pain, blood in stool, constipation, diarrhea, nausea and vomiting.  Genitourinary: Negative for difficulty urinating and hematuria.  Musculoskeletal: Negative for arthralgias, myalgias and neck pain.  Skin: Negative for rash.  Neurological: Negative for dizziness, seizures, syncope and headaches.  Hematological: Negative for adenopathy. Does not bruise/bleed easily.  Psychiatric/Behavioral: Negative for dysphoric mood. The patient is nervous/anxious (pandemic related).    Objective:    BP 136/84 (BP Location: Right Arm, Patient Position: Sitting, Cuff Size: Large)   Pulse 73   Temp 97.7 F (36.5 C) (Temporal)   Ht 5' 5.5" (1.664 m)   Wt 174 lb 1 oz (79 kg)   SpO2 97%   BMI 28.52 kg/m   Wt Readings from Last 3 Encounters:  02/12/20 174 lb 1 oz (79 kg)  02/15/19 155 lb (70.3 kg)  02/02/19 151 lb (68.5 kg)    Physical Exam Vitals and nursing note reviewed.  Constitutional:      General: She is not in acute distress.    Appearance: Normal appearance. She is well-developed. She is not ill-appearing.  HENT:     Head: Normocephalic and atraumatic.     Right Ear: Hearing, tympanic membrane, ear canal and external ear normal.     Left Ear: Hearing, tympanic membrane, ear canal and external  ear normal.     Mouth/Throat:     Pharynx: Uvula midline.  Eyes:     General: No scleral icterus.    Extraocular Movements: Extraocular movements intact.     Conjunctiva/sclera: Conjunctivae normal.     Pupils: Pupils are equal, round, and reactive to light.  Cardiovascular:     Rate and Rhythm: Normal rate and regular rhythm.     Pulses: Normal pulses.            Radial pulses are 2+ on the right side and 2+ on the left side.     Heart sounds: Normal heart sounds. No murmur.  Pulmonary:     Effort: Pulmonary effort is normal. No respiratory distress.     Breath sounds: Normal breath sounds. No wheezing, rhonchi or rales.  Abdominal:     General: Abdomen is flat. Bowel sounds are normal. There is no distension.     Palpations: Abdomen is soft. There is no mass.     Tenderness: There is no abdominal tenderness. There is no guarding or rebound.     Hernia: No hernia is present.  Musculoskeletal:        General: Normal range of motion.     Cervical back: Normal range of motion and neck supple.     Right lower leg: No edema.     Left lower leg: No edema.  Lymphadenopathy:     Cervical: No cervical adenopathy.  Skin:    General: Skin is warm and dry.     Findings: No rash.  Neurological:     General: No focal deficit present.     Mental Status: She is alert and oriented to person, place, and time.     Comments: CN grossly intact, station and gait intact  Psychiatric:        Mood and Affect: Mood normal.        Behavior: Behavior normal.        Thought Content: Thought content normal.        Judgment: Judgment normal.       Results for orders placed or performed in visit on 02/04/20  Ferritin  Result Value Ref Range   Ferritin 111.9 10.0 - 291.0 ng/mL  CBC with Differential  Result Value Ref Range   WBC 8.3 4.0 - 10.5 K/uL   RBC 4.75 3.87 - 5.11 Mil/uL   Hemoglobin 14.9 12.0 - 15.0 g/dL   HCT 43.7 36.0 - 46.0 %   MCV 92.2 78.0 - 100.0 fl   MCHC 34.0 30.0 - 36.0 g/dL   RDW 13.5 11.5 - 15.5 %   Platelets 296.0 150.0 - 400.0 K/uL   Neutrophils Relative % 56.6 43.0 - 77.0 %   Lymphocytes Relative 32.5 12.0 - 46.0 %   Monocytes Relative 7.8 3.0 - 12.0 %   Eosinophils Relative 2.6 0.0 - 5.0 %   Basophils Relative 0.5 0.0 - 3.0 %   Neutro Abs 4.7 1.4 - 7.7 K/uL   Lymphs Abs 2.7 0.7 - 4.0 K/uL   Monocytes Absolute 0.6 0.1 - 1.0  K/uL   Eosinophils Absolute 0.2 0.0 - 0.7 K/uL   Basophils Absolute 0.0 0.0 - 0.1 K/uL  TSH  Result Value Ref Range   TSH 4.29 0.35 - 4.50 uIU/mL  Lipid panel  Result Value Ref Range   Cholesterol 253 (H) 0 - 200 mg/dL   Triglycerides 140.0 0.0 - 149.0 mg/dL   HDL 55.90 >39.00 mg/dL   VLDL 28.0 0.0 - 40.0 mg/dL  LDL Cholesterol 169 (H) 0 - 99 mg/dL   Total CHOL/HDL Ratio 5    NonHDL 197.16   Comprehensive metabolic panel  Result Value Ref Range   Sodium 139 135 - 145 mEq/L   Potassium 4.9 3.5 - 5.1 mEq/L   Chloride 105 96 - 112 mEq/L   CO2 29 19 - 32 mEq/L   Glucose, Bld 95 70 - 99 mg/dL   BUN 9 6 - 23 mg/dL   Creatinine, Ser 0.65 0.40 - 1.20 mg/dL   Total Bilirubin 0.5 0.2 - 1.2 mg/dL   Alkaline Phosphatase 72 39 - 117 U/L   AST 15 0 - 37 U/L   ALT 12 0 - 35 U/L   Total Protein 7.4 6.0 - 8.3 g/dL   Albumin 4.3 3.5 - 5.2 g/dL   GFR 93.70 >60.00 mL/min   Calcium 9.6 8.4 - 10.5 mg/dL   Assessment & Plan:  This visit occurred during the SARS-CoV-2 public health emergency.  Safety protocols were in place, including screening questions prior to the visit, additional usage of staff PPE, and extensive cleaning of exam room while observing appropriate contact time as indicated for disinfecting solutions.  Discussed covid vaccines.  Problem List Items Addressed This Visit    Overweight (BMI 25.0-29.9)   Hypothyroidism    TSH high normal, along with endorsed hypothyroid symptoms of fatigue, cold intolerance, and weight gain (in setting of pandemic). Will increase levothyroxine to 177mcg and RTC lab visit only in 2-3 months      Relevant Medications   levothyroxine (SYNTHROID) 137 MCG tablet   Other Relevant Orders   TSH   T4, free   Hyperlipidemia    Chronic off meds. Strong fmhx CAD/stroke. Discussed statin. She is interested. Will start atorvastatin 20mg  daily, watching for myalgias.  The 10-year ASCVD risk score Mikey Bussing DC Brooke Bonito., et al., 2013) is: 3.4%   Values used to  calculate the score:     Age: 30 years     Sex: Female     Is Non-Hispanic African American: No     Diabetic: No     Tobacco smoker: No     Systolic Blood Pressure: [id number] mmHg     Is BP treated: No     HDL Cholesterol: 55.9 mg/dL     Total Cholesterol: 253 mg/dL       Relevant Medications   atorvastatin (LIPITOR) 20 MG tablet   Other Relevant Orders   Lipid panel   Hepatic function panel   High serum ferritin    Normalized on repeat testing.       Healthcare maintenance - Primary    Preventative protocols reviewed and updated unless pt declined. Discussed healthy diet and lifestyle.           Meds ordered this encounter  Medications  . levothyroxine (SYNTHROID) 137 MCG tablet    Sig: Take 1 tablet (137 mcg total) by mouth daily.    Dispense:  90 tablet    Refill:  3    Note new dose  . ascorbic acid (VITAMIN C) 500 MG tablet    Sig: Take 1 tablet (500 mg total) by mouth daily.    Dispense:     Marland Kitchen Multiple Vitamins-Minerals (MULTIVITAMIN WOMEN) TABS    Sig: Take 1 tablet by mouth daily.  Marland Kitchen atorvastatin (LIPITOR) 20 MG tablet    Sig: Take 1 tablet (20 mg total) by mouth daily at 6 PM.    Dispense:  90 tablet    Refill:  3   Orders Placed This Encounter  Procedures  . TSH    Standing Status:   Future    Standing Expiration Date:   02/11/2021  . Lipid panel    Standing Status:   Future    Standing Expiration Date:   02/11/2021  . T4, free    Standing Status:   Future    Standing Expiration Date:   02/11/2021  . Hepatic function panel    Standing Status:   Future    Standing Expiration Date:   02/11/2021    Patient instructions: Increase levothyroxine to 150mcg daily. Monitor effect on energy, cold intolerance, weight.  Return in 3 months for labs only.  Return in 1 year for next physical. You are doing well today  I hope you get the covid shot soon! [email or web address]   Follow up plan: Return in about 1 year  (around 02/11/2021) for annual exam, prior fasting for blood work.  [name], MD

## 2020-02-12 NOTE — Patient Instructions (Addendum)
Increase levothyroxine to 115mcg daily. Monitor effect on energy, cold intolerance, weight.  Return in 3 months for labs only.  Return in 1 year for next physical. You are doing well today  I hope you get the covid shot soon! [email or web address] vaccination [place]   Health Maintenance for Postmenopausal Women Menopause is a normal process in which your ability to get pregnant comes to an end. This process happens slowly over many months or years, usually between the ages of 82 and 63. Menopause is complete when you have missed your menstrual periods for 12 months. It is important to talk with your health care provider about some of the most common conditions that affect women after menopause (postmenopausal women). These include heart disease, cancer, and bone loss (osteoporosis). Adopting a healthy lifestyle and getting preventive care can help to promote your health and wellness. The actions you take can also lower your chances of developing some of these common conditions. What should I know about menopause? During menopause, you may get a number of symptoms, such as:  Hot flashes. These can be moderate or severe.  Night sweats.  Decrease in sex drive.  Mood swings.  Headaches.  Tiredness.  Irritability.  Memory problems.  Insomnia. Choosing to treat or not to treat these symptoms is a decision that you make with your health care provider. Do I need hormone replacement therapy?  Hormone replacement therapy is effective in treating symptoms that are caused by menopause, such as hot flashes and night sweats.  Hormone replacement carries certain risks, especially as you become older. If you are thinking about using estrogen or estrogen with progestin, discuss the benefits and risks with your health care provider. What is my risk for heart disease and stroke? The risk of heart disease, heart attack, and stroke increases as you age. One of the causes may be a  change in the body's hormones during menopause. This can affect how your body uses dietary fats, triglycerides, and cholesterol. Heart attack and stroke are medical emergencies. There are many things that you can do to help prevent heart disease and stroke. Watch your blood pressure  High blood pressure causes heart disease and increases the risk of stroke. This is more likely to develop in people who have high blood pressure readings, are of African descent, or are overweight.  Have your blood pressure checked: ? Every 3-5 years if you are 17-15 years of age. ? Every year if you are 78 years old or older. Eat a healthy diet   Eat a diet that includes plenty of vegetables, fruits, low-fat dairy products, and lean protein.  Do not eat a lot of foods that are high in solid fats, added sugars, or sodium. Get regular exercise Get regular exercise. This is one of the most important things you can do for your health. Most adults should:  Try to exercise for at least 150 minutes each week. The exercise should increase your heart rate and make you sweat (moderate-intensity exercise).  Try to do strengthening exercises at least twice each week. Do these in addition to the moderate-intensity exercise.  Spend less time sitting. Even light physical activity can be beneficial. Other tips  Work with your health care provider to achieve or maintain a healthy weight.  Do not use any products that contain nicotine or tobacco, such as cigarettes, e-cigarettes, and chewing tobacco. If you need help quitting, ask your health care provider.  Know your numbers. Ask your health care  provider to check your cholesterol and your blood sugar (glucose). Continue to have your blood tested as directed by your health care provider. Do I need screening for cancer? Depending on your health history and family history, you may need to have cancer screening at different stages of your life. This may include screening  for:  Breast cancer.  Cervical cancer.  Lung cancer.  Colorectal cancer. What is my risk for osteoporosis? After menopause, you may be at increased risk for osteoporosis. Osteoporosis is a condition in which bone destruction happens more quickly than new bone creation. To help prevent osteoporosis or the bone fractures that can happen because of osteoporosis, you may take the following actions:  If you are 47-80 years old, get at least 1,000 mg of calcium and at least 600 mg of vitamin D per day.  If you are older than age 30 but younger than age 18, get at least 1,200 mg of calcium and at least 600 mg of vitamin D per day.  If you are older than age 81, get at least 1,200 mg of calcium and at least 800 mg of vitamin D per day. Smoking and drinking excessive alcohol increase the risk of osteoporosis. Eat foods that are rich in calcium and vitamin D, and do weight-bearing exercises several times each week as directed by your health care provider. How does menopause affect my mental health? Depression may occur at any age, but it is more common as you become older. Common symptoms of depression include:  Low or sad mood.  Changes in sleep patterns.  Changes in appetite or eating patterns.  Feeling an overall lack of motivation or enjoyment of activities that you previously enjoyed.  Frequent crying spells. Talk with your health care provider if you think that you are experiencing depression. General instructions See your health care provider for regular wellness exams and vaccines. This may include:  Scheduling regular health, dental, and eye exams.  Getting and maintaining your vaccines. These include: ? Influenza vaccine. Get this vaccine each year before the flu season begins. ? Pneumonia vaccine. ? Shingles vaccine. ? Tetanus, diphtheria, and pertussis (Tdap) booster vaccine. Your health care provider may also recommend other immunizations. Tell your health care provider  if you have ever been abused or do not feel safe at home. Summary  Menopause is a normal process in which your ability to get pregnant comes to an end.  This condition causes hot flashes, night sweats, decreased interest in sex, mood swings, headaches, or lack of sleep.  Treatment for this condition may include hormone replacement therapy.  Take actions to keep yourself healthy, including exercising regularly, eating a healthy diet, watching your weight, and checking your blood pressure and blood sugar levels.  Get screened for cancer and depression. Make sure that you are up to date with all your vaccines. This information is not intended to replace advice given to you by your health care provider. Make sure you discuss any questions you have with your health care provider. Document Revised: 11/08/2018 Document Reviewed: 11/08/2018 Elsevier Patient Education  2020 [employer].

## 2020-02-12 NOTE — Assessment & Plan Note (Signed)
TSH high normal, along with endorsed hypothyroid symptoms of fatigue, cold intolerance, and weight gain (in setting of pandemic). Will increase levothyroxine to 114mcg and RTC lab visit only in 2-3 months

## 2020-02-12 NOTE — Assessment & Plan Note (Addendum)
Chronic off meds. Strong fmhx CAD/stroke. Discussed statin. She is interested. Will start atorvastatin 20mg  daily, watching for myalgias.  The 10-year ASCVD risk score Mikey Bussing DC Brooke Bonito., et al., 2013) is: 3.4%   Values used to calculate the score:     Age: 59 years     Sex: Female     Is Non-Hispanic African American: No     Diabetic: No     Tobacco smoker: No     Systolic Blood Pressure: [id number] mmHg     Is BP treated: No     HDL Cholesterol: 55.9 mg/dL     Total Cholesterol: 253 mg/dL

## 2020-02-12 NOTE — Assessment & Plan Note (Signed)
Preventative protocols reviewed and updated unless pt declined. Discussed healthy diet and lifestyle.  

## 2020-02-12 NOTE — Assessment & Plan Note (Signed)
Normalized on repeat testing.

## 2020-06-26 ENCOUNTER — Telehealth: Payer: Self-pay

## 2020-06-26 NOTE — Telephone Encounter (Signed)
[place] Night - Client Nonclinical Telephone Record AccessNurse Client [place] Night - Client Client Site [place] Physician [name] - MD Contact Type Call Who Is Calling Patient / Member / Family / Caregiver Caller Name Jefferey Pica Phone Number [phone] Patient Name Cheryl Brady Patient DOB 1960-12-07 Call Type Message Only Information Provided Reason for Call Request for General Office Information Initial Comment Caller's wife had her syntrhoid increased. No triage. Caller requesting a return call to discuss side effects Additional Comment Office hours were provided. Disp. Time Disposition Final User 06/26/2020 7:21:16 AM General Information Provided Yes Jaclyn Prime Call Closed By: Jaclyn Prime Transaction Date/Time: 06/26/2020 7:19:19 AM (ET)

## 2020-06-26 NOTE — Telephone Encounter (Signed)
Cheryl Brady notified as instructed by telephone.  She states every since she started the new dose of thyroid medication she has been sick and emotional.  She started crying on the phone and states all she wants is for Dr. Darnell Level to send in a prescription for her previous dose to [facility]. Please advise.

## 2020-06-26 NOTE — Telephone Encounter (Signed)
I don't think this is thyroid related but I did want her to return for repeat labs after thyroid med change.  Agree with [facility] eval for nausea and RUQ pain.  plz call for update on symptoms.

## 2020-06-26 NOTE — Telephone Encounter (Signed)
LVM on both numbers 

## 2020-06-26 NOTE — Telephone Encounter (Signed)
I spoke with Tim (DPR signed) and pt is having nausea,anxiety,extreme tiredness,muscle weakness,H/A, diarrhea on and off for 2 wks; last diarrhea was this morning, chilling, pt is presently under 4 blankets. pts husband does not think pt has a fever. Pt also has extreme soreness and swelling under GB incision that was done 20 yrs ago.Symptoms have been going on for few wks. Pt is wondering if increasing thyroid med in March could be causing this because if pt skips levothyroxine she still has symptoms but the severity is lessened. There are no appts at [facility] and pts husband will take pt to [facility] in [place]. FYI to Dr Darnell Level.

## 2020-06-27 ENCOUNTER — Emergency Department
Admission: EM | Admit: 2020-06-27 | Discharge: 2020-06-27 | Disposition: A | Discharge status: Home / Self Care | Attending: Emergency Medicine | Admitting: Emergency Medicine

## 2020-06-27 ENCOUNTER — Other Ambulatory Visit: Payer: Self-pay

## 2020-06-27 ENCOUNTER — Encounter: Payer: Self-pay | Admitting: Emergency Medicine

## 2020-06-27 DIAGNOSIS — Z87891 Personal history of nicotine dependence: Secondary | ICD-10-CM | POA: Insufficient documentation

## 2020-06-27 DIAGNOSIS — Z91040 Latex allergy status: Secondary | ICD-10-CM | POA: Insufficient documentation

## 2020-06-27 DIAGNOSIS — R110 Nausea: Secondary | ICD-10-CM | POA: Insufficient documentation

## 2020-06-27 DIAGNOSIS — Z79899 Other long term (current) drug therapy: Secondary | ICD-10-CM | POA: Diagnosis not present

## 2020-06-27 DIAGNOSIS — I10 Essential (primary) hypertension: Secondary | ICD-10-CM | POA: Diagnosis not present

## 2020-06-27 DIAGNOSIS — T50905A Adverse effect of unspecified drugs, medicaments and biological substances, initial encounter: Secondary | ICD-10-CM

## 2020-06-27 DIAGNOSIS — T381X5A Adverse effect of thyroid hormones and substitutes, initial encounter: Secondary | ICD-10-CM | POA: Diagnosis not present

## 2020-06-27 DIAGNOSIS — R531 Weakness: Secondary | ICD-10-CM | POA: Diagnosis not present

## 2020-06-27 DIAGNOSIS — R42 Dizziness and giddiness: Secondary | ICD-10-CM | POA: Insufficient documentation

## 2020-06-27 DIAGNOSIS — E039 Hypothyroidism, unspecified: Secondary | ICD-10-CM | POA: Diagnosis not present

## 2020-06-27 LAB — COMPREHENSIVE METABOLIC PANEL
ALT: 15 U/L (ref 0–44)
AST: 18 U/L (ref 15–41)
Albumin: 4.5 g/dL (ref 3.5–5.0)
Alkaline Phosphatase: 62 U/L (ref 38–126)
Anion gap: 10 (ref 5–15)
BUN: 11 mg/dL (ref 6–20)
CO2: 25 mmol/L (ref 22–32)
Calcium: 9.7 mg/dL (ref 8.9–10.3)
Chloride: 105 mmol/L (ref 98–111)
Creatinine, Ser: 0.61 mg/dL (ref 0.44–1.00)
GFR calc Af Amer: >60 mL/min (ref >60)
GFR calc non Af Amer: >60 mL/min (ref >60)
Glucose, Bld: 149 mg/dL — ABNORMAL HIGH (ref 70–99)
Potassium: 3.5 mmol/L (ref 3.5–5.1)
Sodium: 140 mmol/L (ref 135–145)
Total Bilirubin: 0.6 mg/dL (ref 0.3–1.2)
Total Protein: 7.7 g/dL (ref 6.5–8.1)

## 2020-06-27 LAB — CBC
HCT: 43.9 % (ref 36.0–46.0)
Hemoglobin: 14.8 g/dL (ref 12.0–15.0)
MCH: 31.1 pg (ref 26.0–34.0)
MCHC: 33.7 g/dL (ref 30.0–36.0)
MCV: 92.2 fL (ref 80.0–100.0)
Platelets: 290 10*3/uL (ref 150–400)
RBC: 4.76 MIL/uL (ref 3.87–5.11)
RDW: 12.5 % (ref 11.5–15.5)
WBC: 10.8 10*3/uL — ABNORMAL HIGH (ref 4.0–10.5)
nRBC: 0 % (ref 0.0–0.2)

## 2020-06-27 LAB — TSH: TSH: 4.069 u[IU]/mL (ref 0.350–4.500)

## 2020-06-27 LAB — URINALYSIS, COMPLETE (UACMP) WITH MICROSCOPIC
Bilirubin Urine: NEGATIVE
Glucose, UA: NEGATIVE mg/dL
Hgb urine dipstick: NEGATIVE
Ketones, ur: NEGATIVE mg/dL
Leukocytes,Ua: NEGATIVE
Nitrite: NEGATIVE
Protein, ur: NEGATIVE mg/dL
Specific Gravity, Urine: 1.015 (ref 1.005–1.030)
pH: 7 (ref 5.0–8.0)

## 2020-06-27 LAB — T4, FREE: Free T4: 0.69 ng/dL (ref 0.61–1.12)

## 2020-06-27 MED ORDER — LEVOTHYROXINE SODIUM 125 MCG PO TABS: 125.0000 ug | ORAL_TABLET | Freq: Every day | ORAL | 1 refills | Status: DC

## 2020-06-27 NOTE — Telephone Encounter (Signed)
Spouse called back pt did have allergic  re action to levothyroxine (synthroid)   Pt is home

## 2020-06-27 NOTE — Telephone Encounter (Signed)
plz notify I have sent in prior dose of levothyroxine.  Plz call for update on symptoms currently at ER being evaluated

## 2020-06-27 NOTE — ED Provider Notes (Signed)
[facility] Emergency Department Provider Note  ____________________________________________   First MD Initiated Contact with Patient 06/27/20 1404     (approximate)  I have reviewed the triage vital signs and the nursing notes.   HISTORY  Chief Complaint Dizziness and Thyroid Problem    HPI Cheryl Brady is a 59 y.o. female  Here with palpitations, lightheadedness/dizziness, anxiety in setting of increasing her synthroid. Pt states that she recently had a new, higher dose of synthroid called in for her. She states it was also a different shape and supplier than usual. She took this yesterday and later in the afternoon, began feeling anxious, shaky, and tremulous. She felt dizzy. She states she has had these sx before when changing her synthroid dose. She did take the dose again this AM. She called her pCP to discuss who sent her here for evaluation. No overt CP, palpitations. No focal numbness or weakness. No n/v/d. No other complaints.       Past Medical History:  Diagnosis Date  . Fibromyalgia 2012   dx by prior PCP  . History of diabetes mellitus 1997-2016   DSME [facility] 03/2013, resolved with 53lb weight loss 2016  . History of iron deficiency anemia 2007  . Hyperlipidemia   . Hypertension   . Hypothyroid   . Migraines    silent migraines - dizzy and nauseated, photophobia  . Perennial allergic rhinitis     Patient Active Problem List   Diagnosis Date Noted  . Overweight (BMI 25.0-29.9) 02/12/2020  . Acute lower GI bleeding 02/14/2019  . OB + stool   . Polyp of sigmoid colon   . Diverticulosis of large intestine without diverticulitis   . Benign neoplasm of ascending colon   . High serum ferritin 10/04/2018  . Rash of face 05/01/2015  . Hip pain, bilateral 09/26/2014  . intermetatarsal bursitis L foot 01/01/2014  . Hyperlipidemia   . Healthcare maintenance 09/21/2012  . Perennial allergic rhinitis   . Migraines   . Hypothyroidism     . Fibromyalgia     Past Surgical History:  Procedure Laterality Date  . ABDOMINAL HYSTERECTOMY    . CHOLECYSTECTOMY  1989  . COLONOSCOPY N/A 02/15/2019   done for hematochezia - diverticulosis, distal sigmoid colon ulcer injected and treated with bipolar cautery Alice Reichert, Benay Pike, MD)  . COLONOSCOPY WITH PROPOFOL N/A 02/02/2019   TAx2 (one 1.5 cm), diverticulosis, rpt 3 yrs (Tahiliani, Lennette Bihari, MD)  . LAPAROSCOPIC HYSTERECTOMY  2007   total, BSO, for heavy bleeding  . TUBAL LIGATION      Prior to Admission medications   Medication Sig Start Date End Date Taking? Authorizing Provider  ascorbic acid (VITAMIN C) 500 MG tablet Take 1 tablet (500 mg total) by mouth daily. 02/12/20   [name], MD  atorvastatin (LIPITOR) 20 MG tablet Take 1 tablet (20 mg total) by mouth daily at 6 PM. 02/12/20   [name], MD  levothyroxine (SYNTHROID) 125 MCG tablet Take 1 tablet (125 mcg total) by mouth daily. 06/27/20   [name], MD  Multiple Vitamins-Minerals (MULTIVITAMIN WOMEN) TABS Take 1 tablet by mouth daily. 02/12/20   [name], MD    Allergies Amaryl [glimepiride], Cymbalta [duloxetine hcl], Januvia [sitagliptin], Metformin and related, Adhesive [tape], and Latex  Family History  Problem Relation Age of Onset  . Stroke Mother   . Coronary artery disease Father   . Stroke Father   . ALS Father        familial  .  ALS Paternal Aunt        familial  . Breast cancer Paternal Aunt 43  . ALS Cousin        x2, familial   . Diabetes Neg Hx   . Cancer Neg Hx     Social History Social History   Tobacco Use  . Smoking status: Former Research scientist (life sciences)  . Smokeless tobacco: Never Used  Vaping Use  . Vaping Use: Never used  Substance Use Topics  . Alcohol use: No  . Drug use: No    Review of Systems  Review of Systems  Constitutional: Positive for fatigue. Negative for chills and fever.  HENT: Negative for sore throat.   Respiratory: Negative for shortness of  breath.   Cardiovascular: Negative for chest pain.  Gastrointestinal: Positive for nausea. Negative for abdominal pain.  Genitourinary: Negative for flank pain.  Musculoskeletal: Negative for neck pain.  Skin: Negative for rash and wound.  Allergic/Immunologic: Negative for immunocompromised state.  Neurological: Positive for dizziness, weakness and light-headedness. Negative for numbness.  Hematological: Does not bruise/bleed easily.  Psychiatric/Behavioral: The patient is nervous/anxious.   All other systems reviewed and are negative.    ____________________________________________  PHYSICAL EXAM:      VITAL SIGNS: ED Triage Vitals [06/27/20 0838]  Enc Vitals Group     BP (!) 159/82     Pulse Rate 90     Resp 18     Temp 98.9 F (37.2 C)     Temp Source Oral     SpO2 97 %     Weight 190 lb (86.2 kg)     Height 5\' 5"  (1.651 m)     Head Circumference      Peak Flow      Pain Score 0     Pain Loc      Pain Edu?      Excl. in [place]?      Physical Exam Vitals and nursing note reviewed.  Constitutional:      General: She is not in acute distress.    Appearance: She is well-developed.  HENT:     Head: Normocephalic and atraumatic.  Eyes:     Conjunctiva/sclera: Conjunctivae normal.  Cardiovascular:     Rate and Rhythm: Normal rate and regular rhythm.     Heart sounds: Normal heart sounds. No murmur heard.  No friction rub.  Pulmonary:     Effort: Pulmonary effort is normal. No respiratory distress.     Breath sounds: Normal breath sounds. No wheezing or rales.  Abdominal:     General: There is no distension.     Palpations: Abdomen is soft.     Tenderness: There is no abdominal tenderness.  Musculoskeletal:     Cervical back: Neck supple.  Skin:    General: Skin is warm.     Capillary Refill: Capillary refill takes less than 2 seconds.  Neurological:     Mental Status: She is alert and oriented to person, place, and time.     Motor: No abnormal muscle tone.        ____________________________________________   LABS (all labs ordered are listed, but only abnormal results are displayed)  Labs Reviewed  CBC - Abnormal; Notable for the following components:      Result Value   WBC 10.8 (*)    All other components within normal limits  URINALYSIS, COMPLETE (UACMP) WITH MICROSCOPIC - Abnormal; Notable for the following components:   Color, Urine YELLOW (*)    APPearance CLEAR (*)  Bacteria, UA RARE (*)    All other components within normal limits  COMPREHENSIVE METABOLIC PANEL - Abnormal; Notable for the following components:   Glucose, Bld 149 (*)    All other components within normal limits  TSH  T4, FREE    ____________________________________________  EKG: Normal sinus rhythm, VR 84. PR 170, QRS 74, QTc 430. Non-specific ST changes. No acute st elevations or depressions. No ischemia or infarct. ________________________________________  RADIOLOGY All imaging, including plain films, CT scans, and ultrasounds, independently reviewed by me, and interpretations confirmed via formal radiology reads.  ED MD interpretation:   None  Official radiology report(s): No results found.  ____________________________________________  PROCEDURES   Procedure(s) performed (including Critical Care):  Procedures  ____________________________________________  INITIAL IMPRESSION / MDM / [place] / ED COURSE  As part of my medical decision making, I reviewed the following data within the [facility] notes reviewed and incorporated, Old chart reviewed, Notes from prior ED visits, and [place] Controlled Substance Database       *Cheryl Brady was evaluated in Emergency Department on 06/27/2020 for the symptoms described in the history of present illness. She was evaluated in the context of the global COVID-19 pandemic, which necessitated consideration that the patient might be at risk for infection with the  SARS-CoV-2 virus that causes COVID-19. Institutional protocols and algorithms that pertain to the evaluation of patients at risk for COVID-19 are in a state of rapid change based on information released by regulatory bodies including the CDC and federal and state organizations. These policies and algorithms were followed during the patient's care in the ED.  Some ED evaluations and interventions may be delayed as a result of limited staffing during the pandemic.*     Medical Decision Making:  Very pleasant 59 yo F here with anxiety, lightheadedness, dizziness, tremors after taking higher dose of synthroid provided by likely different manufacturer. Suspect adverse effect of medication. She is o/w well appearing and non-toxic. No signs of thyrotoxicosis clinically. Her TSH, free T4 are wnl. CBC, lytes are unremarkable. EKG is non ischemic. No focal neuro deficits and she can ambulate w/o difficulty. Will have her go back to her prior decreased dosage from same manufacturer, which has been called in her by her PCP. No other apparent acute emergent issues.  ____________________________________________  FINAL CLINICAL IMPRESSION(S) / ED DIAGNOSES  Final diagnoses:  Adverse effect of drug, initial encounter     MEDICATIONS GIVEN DURING THIS VISIT:  Medications - No data to display   ED Discharge Orders    None       Note:  This document was prepared using Dragon voice recognition software and may include unintentional dictation errors.   Duffy Bruce, MD 06/27/20 1725

## 2020-06-27 NOTE — ED Triage Notes (Signed)
Pt has been feeling dizzy and foggy headed since starting new thyroid medication this tuesday.  Called PCP and they told her to go to urgent care.  Pt tried halving the dose of new med but still did not feel right.

## 2020-06-27 NOTE — Telephone Encounter (Signed)
plz notify I have sent in prior dose of levothyroxine however I do recommend she be further evaluated for her recent symptoms - HA, weakness, fatigue, nausea and diarrhea - whether in office or at [facility]

## 2020-06-27 NOTE — Addendum Note (Signed)
Addended by: [name] on: 06/27/2020 12:42 AM   Modules accepted: Orders

## 2020-06-27 NOTE — Discharge Instructions (Addendum)
I would recommend getting your new prescription filled immediately, and throwing your other pills away  [place], take a picture or make note of the shape/brand/dose, so you remember  Drink plenty of fluids  Avoid high amounts of caffeine or alcohol

## 2020-06-27 NOTE — Telephone Encounter (Signed)
Patient's husband notified of rx and he'll pick it up.  They will stay at the ER.

## 2020-06-27 NOTE — ED Notes (Signed)
MD at bedside. 

## 2020-06-27 NOTE — ED Notes (Addendum)
Pt denies pain or discomfort at this time, reports she has history of thyroid disease and medication dose was adjusted and she was recommended by PCP to be seen due to dizziness/brain fog.

## 2020-06-27 NOTE — Telephone Encounter (Signed)
Patient still has nausea,severe headache, and dizzy. Patient's been at ER for 5 hours and they still haven't been seen.

## 2020-06-27 NOTE — Telephone Encounter (Signed)
Patient's husband called in stating they went to UC last night but they were unable to assist, so they were sent to the ER. Wanted to update PCP.

## 2020-06-29 DIAGNOSIS — U071 COVID-19: Secondary | ICD-10-CM

## 2020-06-29 HISTORY — DX: COVID-19: U07.1

## 2020-06-30 ENCOUNTER — Telehealth: Payer: Self-pay

## 2020-06-30 ENCOUNTER — Telehealth: Admitting: Family Medicine

## 2020-06-30 NOTE — Telephone Encounter (Signed)
[place] Night - Client Nonclinical Telephone Record AccessNurse Client [name] - Client Client Site [place] Physician [name] - MD Contact Type Call Who Is Calling Patient / Member / Family / Caregiver Caller Name Karrie Fluellen Caller Phone Number [phone] Patient Name Cheryl Brady Patient DOB 05/26/1961 Call Type Message Only Information Provided Reason for Call Request to Schedule Office Appointment Initial Comment Caller states he needs to make a follow up appt. Disp. Time Disposition Final User 06/30/2020 8:09:25 AM General Information Provided Yes Green, Amy Call Closed By: Philis Kendall Transaction Date/Time: 06/30/2020 8:07:11 AM (ET)

## 2020-06-30 NOTE — Telephone Encounter (Signed)
[place] called pt to schedule ED FU but pt still having symptoms per [place]. I spoke with pt and pts husband and pt said she is not having shakiness and lightheadedness now; no H/A,CP or SOB. Pt said virtually all symptoms have basically subsided.and pt is feeling better. Today FBS was 92, BP & P now 139/86 P 86.pt and her husband have the grandchildren all this week and wants to know if ED FU on 07/01/20 at 3:30 could be virtual appt. I spoke with Dr Darnell Level and he said if pt could not come in at 12:30 could do virtual at 3:30 tomorrow. Pt scheduled ED FU on 07/01/20 at 3:30 and will have vital signs ready when CMA calls. UC & ED precautions given and pt and her husband both voiced understanding. FYI to Dr Darnell Level.

## 2020-06-30 NOTE — Telephone Encounter (Signed)
Noted. Thanks.

## 2020-07-01 ENCOUNTER — Encounter: Payer: Self-pay | Admitting: Family Medicine

## 2020-07-01 ENCOUNTER — Telehealth (INDEPENDENT_AMBULATORY_CARE_PROVIDER_SITE_OTHER): Admitting: Family Medicine

## 2020-07-01 ENCOUNTER — Other Ambulatory Visit: Payer: Self-pay

## 2020-07-01 VITALS — BP 118/83 | HR 76 | Temp 98.0°F | Ht 65.0 in | Wt 178.0 lb

## 2020-07-01 DIAGNOSIS — T50905A Adverse effect of unspecified drugs, medicaments and biological substances, initial encounter: Secondary | ICD-10-CM

## 2020-07-01 DIAGNOSIS — E039 Hypothyroidism, unspecified: Secondary | ICD-10-CM | POA: Diagnosis not present

## 2020-07-01 NOTE — Progress Notes (Signed)
Virtual visit completed through MyChart, a video enabled telemedicine application. Due to national recommendations of social distancing due to COVID-19, a virtual visit is felt to be most appropriate for this patient at this time. Reviewed limitations, risks, security and privacy concerns of performing a virtual visit and the availability of in person appointments. I also reviewed that there may be a patient responsible charge related to this service. The patient agreed to proceed.   Patient location: home Provider location: [place] [facility], office Persons participating in this virtual visit: patient, provider, husband also on call  If any vitals were documented, they were collected by patient at home unless specified below.    BP 118/83   Pulse 76   Temp 98 F (36.7 C)   Ht 5\' 5"  (1.651 m)   Wt 178 lb (80.7 kg)   BMI 29.62 kg/m    CC: ER f/u visit  Subjective:    Patient ID: Cheryl Brady, Cheryl Brady    DOB: 23-Oct-1961, 59 y.o.   MRN: [id number]  HPI: Cheryl Brady is a 59 y.o. Cheryl Brady presenting on 07/01/2020 for Hospitalization Follow-up (Seen at [facility] ED on 06/27/20.  Fasting BS this morning was 94. )   Seen last week with dizziness, palpitations, anxiety in setting of increased levothyroxine dose. This was a different formulary than her normal 189mcg dose. Labs showed normal UA, CBC (wbc 10.8), kidneys, liver and TSH 4.0, fT4 0.69.   She started higher levothyroxine 124mcg dose (was brand Synthroid).  Immediately started nausea, lightheadedness, shaking, mood lability, hyperglycemia and elevated BP.   Was receiving Synthroid through [place].  Planning to switch pharmacy to [facility].  Did complete Pfizer COVID vaccine.       Relevant past medical, surgical, family and social history reviewed and updated as indicated. Interim medical history since our last visit reviewed. Allergies and medications reviewed and updated. Outpatient Medications Prior to Visit  Medication Sig  Dispense Refill  . ascorbic acid (VITAMIN C) 500 MG tablet Take 1 tablet (500 mg total) by mouth daily.    Marland Kitchen levothyroxine (SYNTHROID) 125 MCG tablet Take 1 tablet (125 mcg total) by mouth daily. 90 tablet 1  . Multiple Vitamins-Minerals (MULTIVITAMIN WOMEN) TABS Take 1 tablet by mouth daily.    Marland Kitchen atorvastatin (LIPITOR) 20 MG tablet Take 1 tablet (20 mg total) by mouth daily at 6 PM. (Patient not taking: Reported on 07/01/2020) 90 tablet 3   No facility-administered medications prior to visit.     Per HPI unless specifically indicated in ROS section below Review of Systems Objective:  BP 118/83   Pulse 76   Temp 98 F (36.7 C)   Ht 5\' 5"  (1.651 m)   Wt 178 lb (80.7 kg)   BMI 29.62 kg/m   Wt Readings from Last 3 Encounters:  07/01/20 178 lb (80.7 kg)  06/27/20 190 lb (86.2 kg)  02/12/20 174 lb 1 oz (79 kg)       Physical exam: Gen: alert, NAD, not ill appearing Pulm: speaks in complete sentences without increased work of breathing Psych: normal mood, normal thought content      Results for orders placed or performed during the hospital encounter of 06/27/20  CBC  Result Value Ref Range   WBC 10.8 (H) 4.0 - 10.5 K/uL   RBC 4.76 3.87 - 5.11 MIL/uL   Hemoglobin 14.8 12.0 - 15.0 g/dL   HCT 43.9 36 - 46 %   MCV 92.2 80.0 - 100.0 fL  MCH 31.1 26.0 - 34.0 pg   MCHC 33.7 30.0 - 36.0 g/dL   RDW 12.5 11.5 - 15.5 %   Platelets 290 150 - 400 K/uL   nRBC 0.0 0.0 - 0.2 %  Urinalysis, Complete w Microscopic  Result Value Ref Range   Color, Urine YELLOW (A) YELLOW   APPearance CLEAR (A) CLEAR   Specific Gravity, Urine 1.015 1.005 - 1.030   pH 7.0 5.0 - 8.0   Glucose, UA NEGATIVE NEGATIVE mg/dL   Hgb urine dipstick NEGATIVE NEGATIVE   Bilirubin Urine NEGATIVE NEGATIVE   Ketones, ur NEGATIVE NEGATIVE mg/dL   Protein, ur NEGATIVE NEGATIVE mg/dL   Nitrite NEGATIVE NEGATIVE   Leukocytes,Ua NEGATIVE NEGATIVE   RBC / HPF 0-5 0 - 5 RBC/hpf   WBC, UA 0-5 0 - 5 WBC/hpf   Bacteria, UA  RARE (A) NONE SEEN   Squamous Epithelial / LPF 0-5 0 - 5   Mucus PRESENT   Comprehensive metabolic panel  Result Value Ref Range   Sodium 140 135 - 145 mmol/L   Potassium 3.5 3.5 - 5.1 mmol/L   Chloride 105 98 - 111 mmol/L   CO2 25 22 - 32 mmol/L   Glucose, Bld 149 (H) 70 - 99 mg/dL   BUN 11 6 - 20 mg/dL   Creatinine, Ser 0.61 0.44 - 1.00 mg/dL   Calcium 9.7 8.9 - 10.3 mg/dL   Total Protein 7.7 6.5 - 8.1 g/dL   Albumin 4.5 3.5 - 5.0 g/dL   AST 18 15 - 41 U/L   ALT 15 0 - 44 U/L   Alkaline Phosphatase 62 38 - 126 U/L   Total Bilirubin 0.6 0.3 - 1.2 mg/dL   GFR calc non Af Amer >60 >60 mL/min   GFR calc Af Amer >60 >60 mL/min   Anion gap 10 5 - 15  TSH  Result Value Ref Range   TSH 4.069 0.350 - 4.500 uIU/mL  T4, free  Result Value Ref Range   Free T4 0.69 0.61 - 1.12 ng/dL   Assessment & Plan:   Problem List Items Addressed This Visit    Hypothyroidism    At last visit had recommended increasing levothyroxine dose to 173mcg due to endorsed fatigue, cold intolerance weight gain, and borderline high TSH - she had only just started higher dose a few days prior to symptoms developing. Doing much better on previous dose of 174mcg levothyroxine generic daily - continue this.       Adverse reaction to drug - Primary    Symptoms correlate to starting brand Synthroid 152mcg (from prior levothyroxine 156mcg dose). Will add brand Synthroid to allergy list, tolerates generic levothyroxine well. ER records reviewed - labwork overall reassuring. Will recheck labs at next OV.           No orders of the defined types were placed in this encounter.  No orders of the defined types were placed in this encounter.   I discussed the assessment and treatment plan with the patient. The patient was provided an opportunity to ask questions and all were answered. The patient agreed with the plan and demonstrated an understanding of the instructions. The patient was advised to call back or seek an  in-person evaluation if the symptoms worsen or if the condition fails to improve as anticipated.  Follow up plan: No follow-ups on file.  [name], MD

## 2020-07-01 NOTE — Assessment & Plan Note (Signed)
At last visit had recommended increasing levothyroxine dose to 153mcg due to endorsed fatigue, cold intolerance weight gain, and borderline high TSH - she had only just started higher dose a few days prior to symptoms developing. Doing much better on previous dose of 159mcg levothyroxine generic daily - continue this.

## 2020-07-01 NOTE — Assessment & Plan Note (Addendum)
Symptoms correlate to starting brand Synthroid 167mcg (from prior levothyroxine 145mcg dose). Will add brand Synthroid to allergy list, tolerates generic levothyroxine well. ER records reviewed - labwork overall reassuring. Will recheck labs at next OV.

## 2020-07-04 ENCOUNTER — Ambulatory Visit: Admitting: Family Medicine

## 2020-07-09 ENCOUNTER — Telehealth: Payer: Self-pay | Admitting: Family Medicine

## 2020-07-09 ENCOUNTER — Telehealth (INDEPENDENT_AMBULATORY_CARE_PROVIDER_SITE_OTHER): Admitting: Family Medicine

## 2020-07-09 ENCOUNTER — Other Ambulatory Visit: Payer: Self-pay

## 2020-07-09 ENCOUNTER — Encounter: Payer: Self-pay | Admitting: Family Medicine

## 2020-07-09 VITALS — BP 125/79 | HR 76 | Temp 97.9°F | Ht 65.0 in | Wt 175.0 lb

## 2020-07-09 DIAGNOSIS — R4586 Emotional lability: Secondary | ICD-10-CM | POA: Diagnosis not present

## 2020-07-09 DIAGNOSIS — U071 COVID-19: Secondary | ICD-10-CM | POA: Diagnosis not present

## 2020-07-09 NOTE — Assessment & Plan Note (Addendum)
A predominant concern for patient. Anticipate Synthroid related - should improve over the next few weeks.

## 2020-07-09 NOTE — Assessment & Plan Note (Addendum)
I still think she had adverse reaction to Synthroid medication 2 wks ago, but now with new COVID symptoms that seem to have started around 07/01/2020 with recent possible exposure. Husband with similar symptoms. Supportive care reviewed - with fluids, rest, vit C, vit D, tylenol (zinc intolerance). Will continue to monitor until symptoms improved. Reviewed isolation precautions, self quarantine recommendation through 07/12/2020.  Discussed mAb infusion treatment - referred to mAb team to discuss. She doesn't think she will want this treatment. Fortunately they have completed COVID vaccination series with [employer].

## 2020-07-09 NOTE — Telephone Encounter (Signed)
I'm sorry to hear this. When was first day of symptoms?  Recommend try to isolate at home from rest of family (ie use different bathroom, everyone wear masks at home, different eating spaces, etc). Recommend quarantine at home for 10 days from first day of symptoms.  She is vaccinated so hopefully has only mild symptoms.  There is a monoclonal antibody infusion treatment that can be helpful - please report her demographic info to the monoclonal ab infusion team.  Please place on covid call list and call again Friday for update.  May offer virtual visit if desired, let me know.

## 2020-07-09 NOTE — Progress Notes (Signed)
Virtual visit completed through MyChart, a video enabled telemedicine application. Due to national recommendations of social distancing due to COVID-19, a virtual visit is felt to be most appropriate for this patient at this time. Reviewed limitations, risks, security and privacy concerns of performing a virtual visit and the availability of in person appointments. I also reviewed that there may be a patient responsible charge related to this service. The patient agreed to proceed.   Patient location: home Provider location: [place] at [facility], office Persons participating in this virtual visit: patient, provider, husband also present  If any vitals were documented, they were collected by patient at home unless specified below.    BP 125/79   Pulse 76   Temp 97.9 F (36.6 C)   Ht 5\' 5"  (1.651 m)   Wt 175 lb (79.4 kg)   SpO2 98%   BMI 29.12 kg/m    CC: diarrhea, positive COVID  Subjective:    Patient ID: Cheryl Brady, female    DOB: 1961/09/02, 59 y.o.   MRN: [id number]  HPI: Cheryl Brady is a 59 y.o. female presenting on 07/09/2020 for Diarrhea (C/o diarrhea, fatigue, loss of taste/smell and loss of appetite. Sxs started as cold-like sxs on 06/28/20. Tested positive for COVID19 by home test on 07/06/20. )   Recent ER visit for presumed adverse effect of brand synthroid (new formulation) - see prior note for details. Ongoing chills, nausea, diarrhea and mood lability for several weeks now. Rhinorrhea congestion and HA started last Tuesday 07/01/2020. Loss of taste/smell started this past weekend.   She had positive home COVID test 07/07/2020 (Binax by [facility]).  No cough, dyspnea, ST, fevers.  Husband also with COVID symptoms that started 07/01/2020 - not as bad though.   They think they picked up [place] from grandson who was sick and had recently traveled with family to [facility].       Relevant past medical, surgical, family and social history reviewed and updated as indicated.  Interim medical history since our last visit reviewed. Allergies and medications reviewed and updated. Outpatient Medications Prior to Visit  Medication Sig Dispense Refill  . ascorbic acid (VITAMIN C) 500 MG tablet Take 1 tablet (500 mg total) by mouth daily.    Marland Kitchen levothyroxine (SYNTHROID) 125 MCG tablet Take 1 tablet (125 mcg total) by mouth daily. 90 tablet 1  . Multiple Vitamins-Minerals (MULTIVITAMIN WOMEN) TABS Take 1 tablet by mouth daily.    Marland Kitchen atorvastatin (LIPITOR) 20 MG tablet Take 1 tablet (20 mg total) by mouth daily at 6 PM. (Patient not taking: Reported on 07/09/2020) 90 tablet 3   No facility-administered medications prior to visit.     Per HPI unless specifically indicated in ROS section below Review of Systems Objective:  BP 125/79   Pulse 76   Temp 97.9 F (36.6 C)   Ht 5\' 5"  (1.651 m)   Wt 175 lb (79.4 kg)   SpO2 98%   BMI 29.12 kg/m   Wt Readings from Last 3 Encounters:  07/09/20 175 lb (79.4 kg)  07/01/20 178 lb (80.7 kg)  06/27/20 190 lb (86.2 kg)       Physical exam: Gen: alert, NAD, not ill appearing Pulm: speaks in complete sentences without increased work of breathing Psych: anxious, tearful with discussion     Results for orders placed or performed during the hospital encounter of 06/27/20  CBC  Result Value Ref Range   WBC 10.8 (H) 4.0 - 10.5 K/uL  RBC 4.76 3.87 - 5.11 MIL/uL   Hemoglobin 14.8 12.0 - 15.0 g/dL   HCT 43.9 36 - 46 %   MCV 92.2 80.0 - 100.0 fL   MCH 31.1 26.0 - 34.0 pg   MCHC 33.7 30.0 - 36.0 g/dL   RDW 12.5 11.5 - 15.5 %   Platelets 290 150 - 400 K/uL   nRBC 0.0 0.0 - 0.2 %  Urinalysis, Complete w Microscopic  Result Value Ref Range   Color, Urine YELLOW (A) YELLOW   APPearance CLEAR (A) CLEAR   Specific Gravity, Urine 1.015 1.005 - 1.030   pH 7.0 5.0 - 8.0   Glucose, UA NEGATIVE NEGATIVE mg/dL   Hgb urine dipstick NEGATIVE NEGATIVE   Bilirubin Urine NEGATIVE NEGATIVE   Ketones, ur NEGATIVE NEGATIVE mg/dL    Protein, ur NEGATIVE NEGATIVE mg/dL   Nitrite NEGATIVE NEGATIVE   Leukocytes,Ua NEGATIVE NEGATIVE   RBC / HPF 0-5 0 - 5 RBC/hpf   WBC, UA 0-5 0 - 5 WBC/hpf   Bacteria, UA RARE (A) NONE SEEN   Squamous Epithelial / LPF 0-5 0 - 5   Mucus PRESENT   Comprehensive metabolic panel  Result Value Ref Range   Sodium 140 135 - 145 mmol/L   Potassium 3.5 3.5 - 5.1 mmol/L   Chloride 105 98 - 111 mmol/L   CO2 25 22 - 32 mmol/L   Glucose, Bld 149 (H) 70 - 99 mg/dL   BUN 11 6 - 20 mg/dL   Creatinine, Ser 0.61 0.44 - 1.00 mg/dL   Calcium 9.7 8.9 - 10.3 mg/dL   Total Protein 7.7 6.5 - 8.1 g/dL   Albumin 4.5 3.5 - 5.0 g/dL   AST 18 15 - 41 U/L   ALT 15 0 - 44 U/L   Alkaline Phosphatase 62 38 - 126 U/L   Total Bilirubin 0.6 0.3 - 1.2 mg/dL   GFR calc non Af Amer >60 >60 mL/min   GFR calc Af Amer >60 >60 mL/min   Anion gap 10 5 - 15  TSH  Result Value Ref Range   TSH 4.069 0.350 - 4.500 uIU/mL  T4, free  Result Value Ref Range   Free T4 0.69 0.61 - 1.12 ng/dL   Assessment & Plan:  She requests f/u OV for hypothyroidism with labs for next month.  Problem List Items Addressed This Visit    Emotional lability    A predominant concern for patient. Anticipate Synthroid related - should improve over the next few weeks.       COVID-19 virus infection - Primary    I still think she had adverse reaction to Synthroid medication 2 wks ago, but now with new COVID symptoms that seem to have started around 07/01/2020 with recent possible exposure. Husband with similar symptoms. Supportive care reviewed - with fluids, rest, vit C, vit D, tylenol (zinc intolerance). Will continue to monitor until symptoms improved. Reviewed isolation precautions, self quarantine recommendation through 07/12/2020.  Discussed mAb infusion treatment - referred to mAb team to discuss. She doesn't think she will want this treatment. Fortunately they have completed COVID vaccination series with [employer].           No orders of  the defined types were placed in this encounter.  No orders of the defined types were placed in this encounter.   I discussed the assessment and treatment plan with the patient. The patient was provided an opportunity to ask questions and all were answered. The patient agreed with the  plan and demonstrated an understanding of the instructions. The patient was advised to call back or seek an in-person evaluation if the symptoms worsen or if the condition fails to improve as anticipated.  Follow up plan: No follow-ups on file.  [name], MD

## 2020-07-09 NOTE — Telephone Encounter (Addendum)
Spoke with pt/pt's husband asking about start of sxs.  States cold-like sxs started on 06/28/20.  I relayed Dr. Synthia Innocent message.  Pt verbalizes understanding and agrees to antibody infusion.  Pt added to Call Log.  Lvm, on [facility] hotline, with pt's info.    Pt scheduled virtual visit today at 1:30.  FYI to Dr. Darnell Level.

## 2020-07-09 NOTE — Telephone Encounter (Signed)
Patient's husband called in stating wife tested positive for COVID-19. Patient is having diarrhea, fatigue, no appetite, and loss of taste and smell. No fever or SOB at this time. Patient is asking if PCP can prescribe anything to assist with diagnosis. Please advise.

## 2020-07-10 ENCOUNTER — Telehealth: Payer: Self-pay | Admitting: Family Medicine

## 2020-07-10 MED ORDER — LORAZEPAM 0.5 MG PO TABS: 0.2500 mg | ORAL_TABLET | Freq: Two times a day (BID) | ORAL | 0 refills | Status: AC | PRN

## 2020-07-10 NOTE — Telephone Encounter (Signed)
plz call for update - how did she do overnight with benadryl? May try lorazepam 0.5mg  1/2-1 tablet BID PRN anxiety/sleep.  Update [place] with effect.

## 2020-07-10 NOTE — Telephone Encounter (Signed)
Patients spouse called office again stating his wife changed her mind and would like the anxiety meds sent to [place]. He also wanted Dr.G or Glorianne Manchester to give him a call once medication is sent.

## 2020-07-10 NOTE — Addendum Note (Signed)
Addended by: [name] on: 07/10/2020 02:40 PM   Modules accepted: Orders

## 2020-07-10 NOTE — Telephone Encounter (Signed)
Spouse called in stating pt changed her mind about med for anxiety and would like them called into  [facility] [address]

## 2020-07-10 NOTE — Telephone Encounter (Signed)
Spoke with pt's husband, Christia Reading (on dpr), notifying him Dr. Darnell Level sent rx for lorazepam to [place] today.  Verbalizes understanding and expresses his thanks.

## 2020-07-10 NOTE — Telephone Encounter (Signed)
Spoke with pt's husband, Christia Reading (on dpr), asking about Benadryl.  States pt did horribly last night.  That's why he was calling requesting rx.  I relayed Dr. Synthia Innocent message.  He verbalizes understanding and again expresses his thanks.

## 2020-07-14 NOTE — Telephone Encounter (Signed)
Spoke with pt asking for update.  Says today is a "good day".  C/o feeling emotional, shaky and mild nausea.  States otherwise, she's feeling better.  States she declined antibody infusion.  Pt is aware Dr. Darnell Level is out of the office.

## 2020-07-16 NOTE — Telephone Encounter (Signed)
Spoke with pt asking for update.  States she now is able to smell and taste food again.  However, she still has some mild nausea and diarrhea, which she says is from the Synthroid.  Also, c/o some brian fog but took 1/2 tab of lorazepam.  FYI to Dr. Darnell Level.

## 2020-07-21 ENCOUNTER — Telehealth: Payer: Self-pay

## 2020-07-21 NOTE — Telephone Encounter (Signed)
[facility] - Client TELEPHONE ADVICE RECORD [facility] Patient Name: Cheryl Brady Gender: Female DOB: [birth year] Age: 59 Y 2 M 16 D Return Phone Number: [phone] (Primary) Address: City/State/Zip: [place] [address] Client [facility] - Client Client Site [place] Physician [name] - MD Contact Type Call Who Is Calling Patient / Member / Family / Caregiver Call Type Triage / Clinical Caller Name Zonia Caplin Relationship To Patient Spouse Return Phone Number [phone] (Primary) Chief Complaint CONFUSION - new onset Reason for Call Symptomatic / Request for [place] states his wife is very nauseous, crying all day and she isnt any better than she was the last time. She is very emotional and caller isnt sure if it because of her change in medication on her Synthroid or if this is something that is due to previous covid. Caller states she still has diarrhea as well along with severe anxiety and fear of why she is feeling like this. he said she is very confused and doesnt know what is happening. Translation No Nurse Assessment Nurse: Zenia Resides, RN, Diane Date/Time Eilene Ghazi Time): 07/21/2020 8:29:14 AM Confirm and document reason for call. If symptomatic, describe symptoms. ---Caller states pt. has nausea and diarrhea and severe anxiety. Pt. has had symptoms for 3 weeks. No abd. pain. Pt. has also had COVID. Pt. is not sleeping well, crying a lot. Pt. is eating and drinking. MD just put pt. on Lorazepam- pt. afraid to take. Pt. is not confused. Has the patient had close contact with a person known or suspected to have the novel coronavirus illness OR traveled / lives in area with major community spread (including international travel) in the last 14 days from the onset of symptoms? * If Asymptomatic, screen for exposure and travel within the  last 14 days. ---No Does the patient have any new or worsening symptoms? ---Yes Will a triage be completed? ---Yes Related visit to physician within the last 2 weeks? ---Yes Does the PT have any chronic conditions? (i.e. diabetes, asthma, this includes High risk factors for pregnancy, etc.) ---Yes List chronic conditions. ---anxiety, hypothyroid, Is this a behavioral health or substance abuse call? ---NoPLEASE NOTE: All timestamps contained within this report are represented as [place] Standard Time. CONFIDENTIALTY NOTICE: This fax transmission is intended only for the addressee. It contains information that is legally privileged, confidential or otherwise protected from use or disclosure. If you are not the intended recipient, you are strictly prohibited from reviewing, disclosing, copying using or disseminating any of this information or taking any action in reliance on or regarding this information. If you have received this fax in error, please notify us immediately by telephone so that we can arrange for its return to [place]. Phone: [phone], Toll-Free: [phone], Fax: [phone] Page: 2 of 2 Call Id: [id number] Guidelines Guideline Title Affirmed Question Affirmed Notes Nurse Date/Time Eilene Ghazi Time) Anxiety and Panic Attack Patient sounds very sick or weak to the triager Zenia Resides, RN, Diane 07/21/2020 8:33:53 AM Disp. Time Eilene Ghazi Time) Disposition Final User 07/21/2020 8:23:11 AM Send to Urgent Sallyanne Havers, [place] 07/21/2020 8:37:08 AM Go to ED Now (or PCP triage) Yes Zenia Resides, RN, Diane Caller Disagree/Comply Disagree Caller Understands Yes PreDisposition InappropriateToAsk Care Advice Given Per Guideline GO TO ED NOW (OR PCP TRIAGE): ANOTHER ADULT SHOULD DRIVE: * It is better and safer if another adult drives instead of you. CARE ADVICE given per Anxiety and Panic Attack (Adult)  guideline. Referrals GO TO FACILITY UNDECIDED

## 2020-07-21 NOTE — Telephone Encounter (Signed)
Received msg form front office that Access nurse called reporting pt's spouse called reporting pt is having severe anxiety and they are recommending her go to the ER but she is refusing so they are asking this office to f/u with pt and her spouse  Contacted pt and spouse, Tim. Tim reports pt has been very emotional, had a lot of anxiety and shaking since 7/30. Pt also has nausea and intermittent diarrhea during the day but not everyday. He reports this started after she was diagnosed with covid and also after starting a new dose of synthroid. He said it did not get any better after they went back to the old dose of levothyroxine.  Pt denies SOB, CP, cough, fever, vomiting, sore throat, fatigue, joint pain, or any changes in taste or smell. Pt denies depression. Pt denies wanting to hurt herself.  Pt reports she has on ly been taking 1/2 tab lorazepam daily because she is afraid of getting addicted. Advised pt the prescription is written as 1/2-1 tab BID and she can take as written if needed. Advised she can take one tab twice daily until her apt tomorrow with PCP if needed. Pt was emotional and crying and said she scared not knowing why she felt like this. Advised PCP would discuss this with her at her apt tomorrow but if any symptoms changed to contact office. Pt verbalized understanding.   Tim reports he will be with pt the entire time and thru out her video visit with PCP tomorrow morning. Advised pt and Tim if anything changes to contact office. Pt and Tim verbalize understanding.

## 2020-07-22 ENCOUNTER — Encounter: Payer: Self-pay | Admitting: Family Medicine

## 2020-07-22 ENCOUNTER — Other Ambulatory Visit: Payer: Self-pay

## 2020-07-22 ENCOUNTER — Telehealth (INDEPENDENT_AMBULATORY_CARE_PROVIDER_SITE_OTHER): Admitting: Family Medicine

## 2020-07-22 VITALS — BP 124/88 | HR 87 | Temp 97.9°F | Ht 65.0 in | Wt 173.0 lb

## 2020-07-22 DIAGNOSIS — E039 Hypothyroidism, unspecified: Secondary | ICD-10-CM | POA: Diagnosis not present

## 2020-07-22 DIAGNOSIS — R4586 Emotional lability: Secondary | ICD-10-CM

## 2020-07-22 DIAGNOSIS — U071 COVID-19: Secondary | ICD-10-CM | POA: Diagnosis not present

## 2020-07-22 NOTE — Assessment & Plan Note (Signed)
Seems fully resolved from recent COVID illness without sequelae.

## 2020-07-22 NOTE — Progress Notes (Signed)
Virtual visit completed through MyChart, a video enabled telemedicine application. Due to national recommendations of social distancing due to COVID-19, a virtual visit is felt to be most appropriate for this patient at this time. Reviewed limitations, risks, security and privacy concerns of performing a virtual visit and the availability of in person appointments. I also reviewed that there may be a patient responsible charge related to this service. The patient agreed to proceed.   Patient location: home Provider location: [place] at [facility], office Persons participating in this virtual visit: patient, provider, husband  If any vitals were documented, they were collected by patient at home unless specified below.    BP 124/88   Pulse 87   Temp 97.9 F (36.6 C)   Ht 5\' 5"  (1.651 m)   Wt 173 lb (78.5 kg)   SpO2 97%   BMI 28.79 kg/m    CC: ongoing anxiety Subjective:    Patient ID: Cheryl Brady, female    DOB: [birth year], 59 y.o.   MRN: [id number]  HPI: Cheryl Brady is a 59 y.o. female presenting on 07/22/2020 for Anxiety (C/o continued anxiety, shakiness and nausea. )   See prior note for details.  Positive home COVID test 07/07/2020.  Family members including husband sick as well.  Initial rhinorrhea, congestion, HA started Tuesday 07/01/2020.   Ongoing emotional lability initially attributed to Brand Synthroid dose change - now back on generic levothyroxine 129mcg daily. Emotional lability has not improved. Took lorazepam 0.5mg  last night and 1mg  this morning.   Ongoing nausea and diarrhea (worse in the mornings). No appetite. Gets shaky and cold.   Never had respiratory COVID symptoms. No fatigue or mental fog. Husband endorses she is staying sharp.  Denies depression - she enjoys getting out of the house.  She is back to taking care of grandson.  Ongoing anxiety.   Son is getting married Oct 23rd.      Relevant past medical, surgical, family and social history  reviewed and updated as indicated. Interim medical history since our last visit reviewed. Allergies and medications reviewed and updated. Outpatient Medications Prior to Visit  Medication Sig Dispense Refill  . ascorbic acid (VITAMIN C) 500 MG tablet Take 1 tablet (500 mg total) by mouth daily.    Marland Kitchen levothyroxine (SYNTHROID) 125 MCG tablet Take 1 tablet (125 mcg total) by mouth daily. 90 tablet 1  . LORazepam (ATIVAN) 0.5 MG tablet Take 0.5-1 tablets (0.25-0.5 mg total) by mouth 2 (two) times daily as needed for anxiety or sleep. 20 tablet 0  . Multiple Vitamins-Minerals (MULTIVITAMIN WOMEN) TABS Take 1 tablet by mouth daily.    Marland Kitchen atorvastatin (LIPITOR) 20 MG tablet Take 1 tablet (20 mg total) by mouth daily at 6 PM. (Patient not taking: Reported on 07/09/2020) 90 tablet 3   No facility-administered medications prior to visit.     Per HPI unless specifically indicated in ROS section below Review of Systems Objective:  BP 124/88   Pulse 87   Temp 97.9 F (36.6 C)   Ht 5\' 5"  (1.651 m)   Wt 173 lb (78.5 kg)   SpO2 97%   BMI 28.79 kg/m   Wt Readings from Last 3 Encounters:  07/22/20 173 lb (78.5 kg)  07/09/20 175 lb (79.4 kg)  07/01/20 178 lb (80.7 kg)       Physical exam: Gen: alert, NAD, not ill appearing Pulm: speaks in complete sentences without increased work of breathing Psych: tearful and anxious, normal thought content  Results for orders placed or performed during the hospital encounter of 06/27/20  CBC  Result Value Ref Range   WBC 10.8 (H) 4.0 - 10.5 K/uL   RBC 4.76 3.87 - 5.11 MIL/uL   Hemoglobin 14.8 12.0 - 15.0 g/dL   HCT 43.9 36 - 46 %   MCV 92.2 80.0 - 100.0 fL   MCH 31.1 26.0 - 34.0 pg   MCHC 33.7 30.0 - 36.0 g/dL   RDW 12.5 11.5 - 15.5 %   Platelets 290 150 - 400 K/uL   nRBC 0.0 0.0 - 0.2 %  Urinalysis, Complete w Microscopic  Result Value Ref Range   Color, Urine YELLOW (A) YELLOW   APPearance CLEAR (A) CLEAR   Specific Gravity, Urine 1.015  1.005 - 1.030   pH 7.0 5.0 - 8.0   Glucose, UA NEGATIVE NEGATIVE mg/dL   Hgb urine dipstick NEGATIVE NEGATIVE   Bilirubin Urine NEGATIVE NEGATIVE   Ketones, ur NEGATIVE NEGATIVE mg/dL   Protein, ur NEGATIVE NEGATIVE mg/dL   Nitrite NEGATIVE NEGATIVE   Leukocytes,Ua NEGATIVE NEGATIVE   RBC / HPF 0-5 0 - 5 RBC/hpf   WBC, UA 0-5 0 - 5 WBC/hpf   Bacteria, UA RARE (A) NONE SEEN   Squamous Epithelial / LPF 0-5 0 - 5   Mucus PRESENT   Comprehensive metabolic panel  Result Value Ref Range   Sodium 140 135 - 145 mmol/L   Potassium 3.5 3.5 - 5.1 mmol/L   Chloride 105 98 - 111 mmol/L   CO2 25 22 - 32 mmol/L   Glucose, Bld 149 (H) 70 - 99 mg/dL   BUN 11 6 - 20 mg/dL   Creatinine, Ser 0.61 0.44 - 1.00 mg/dL   Calcium 9.7 8.9 - 10.3 mg/dL   Total Protein 7.7 6.5 - 8.1 g/dL   Albumin 4.5 3.5 - 5.0 g/dL   AST 18 15 - 41 U/L   ALT 15 0 - 44 U/L   Alkaline Phosphatase 62 38 - 126 U/L   Total Bilirubin 0.6 0.3 - 1.2 mg/dL   GFR calc non Af Amer >60 >60 mL/min   GFR calc Af Amer >60 >60 mL/min   Anion gap 10 5 - 15  TSH  Result Value Ref Range   TSH 4.069 0.350 - 4.500 uIU/mL  T4, free  Result Value Ref Range   Free T4 0.69 0.61 - 1.12 ng/dL   Depression screen [facility] 2/9 02/12/2020 12/13/2018 10/12/2017  Decreased Interest 0 0 0  Down, Depressed, Hopeless 0 0 0  PHQ - 2 Score 0 0 0   No flowsheet data found.  Assessment & Plan:   Problem List Items Addressed This Visit    Hypothyroidism    Anticipate emotional lability from thyroid imbalance vs exacerbation of underlying anxiety. Continue lorazepam 1mg  BID PRN at this time. Declines need for refill yet.       Relevant Orders   TSH   T3   T4, free   Emotional lability - Primary    Ongoing predominant concern. ?thyroid imbalance vs exacerbation of underlying anxiety. No depression.  Check fasting labwork tomorrow morning.  Will schedule sooner OV for close f/u per pt request.       COVID-19 virus infection    Seems fully  resolved from recent COVID illness without sequelae.       Relevant Orders   Comprehensive metabolic panel   CBC with Differential/Platelet       No orders of the defined types were  placed in this encounter.  Orders Placed This Encounter  Procedures  . Comprehensive metabolic panel    Standing Status:   Future    Standing Expiration Date:   07/22/2021  . TSH    Standing Status:   Future    Standing Expiration Date:   07/22/2021  . CBC with Differential/Platelet    Standing Status:   Future    Standing Expiration Date:   07/22/2021  . T3    Standing Status:   Future    Standing Expiration Date:   07/22/2021  . T4, free    Standing Status:   Future    Standing Expiration Date:   07/22/2021    I discussed the assessment and treatment plan with the patient. The patient was provided an opportunity to ask questions and all were answered. The patient agreed with the plan and demonstrated an understanding of the instructions. The patient was advised to call back or seek an in-person evaluation if the symptoms worsen or if the condition fails to improve as anticipated.  Follow up plan: No follow-ups on file.  [name], MD

## 2020-07-22 NOTE — Assessment & Plan Note (Signed)
Ongoing predominant concern. ?thyroid imbalance vs exacerbation of underlying anxiety. No depression.  Check fasting labwork tomorrow morning.  Will schedule sooner OV for close f/u per pt request.

## 2020-07-22 NOTE — Assessment & Plan Note (Signed)
Anticipate emotional lability from thyroid imbalance vs exacerbation of underlying anxiety. Continue lorazepam 1mg  BID PRN at this time. Declines need for refill yet.

## 2020-07-23 ENCOUNTER — Other Ambulatory Visit: Payer: Self-pay

## 2020-07-23 ENCOUNTER — Other Ambulatory Visit (INDEPENDENT_AMBULATORY_CARE_PROVIDER_SITE_OTHER)

## 2020-07-23 DIAGNOSIS — E039 Hypothyroidism, unspecified: Secondary | ICD-10-CM

## 2020-07-23 DIAGNOSIS — U071 COVID-19: Secondary | ICD-10-CM

## 2020-07-23 LAB — CBC WITH DIFFERENTIAL/PLATELET
Basophils Absolute: 0 10*3/uL (ref 0.0–0.1)
Basophils Relative: 0.6 % (ref 0.0–3.0)
Eosinophils Absolute: 0.1 10*3/uL (ref 0.0–0.7)
Eosinophils Relative: 1.1 % (ref 0.0–5.0)
HCT: 43.5 % (ref 36.0–46.0)
Hemoglobin: 15 g/dL (ref 12.0–15.0)
Lymphocytes Relative: 21.3 % (ref 12.0–46.0)
Lymphs Abs: 1.8 10*3/uL (ref 0.7–4.0)
MCHC: 34.4 g/dL (ref 30.0–36.0)
MCV: 91.5 fl (ref 78.0–100.0)
Monocytes Absolute: 0.6 10*3/uL (ref 0.1–1.0)
Monocytes Relative: 7.1 % (ref 3.0–12.0)
Neutro Abs: 5.9 10*3/uL (ref 1.4–7.7)
Neutrophils Relative %: 69.9 % (ref 43.0–77.0)
Platelets: 337 10*3/uL (ref 150.0–400.0)
RBC: 4.76 Mil/uL (ref 3.87–5.11)
RDW: 13.4 % (ref 11.5–15.5)
WBC: 8.4 10*3/uL (ref 4.0–10.5)

## 2020-07-23 LAB — COMPREHENSIVE METABOLIC PANEL
ALT: 17 U/L (ref 0–35)
AST: 15 U/L (ref 0–37)
Albumin: 4.5 g/dL (ref 3.5–5.2)
Alkaline Phosphatase: 65 U/L (ref 39–117)
BUN: 8 mg/dL (ref 6–23)
CO2: 28 mEq/L (ref 19–32)
Calcium: 9.9 mg/dL (ref 8.4–10.5)
Chloride: 104 mEq/L (ref 96–112)
Creatinine, Ser: 0.68 mg/dL (ref 0.40–1.20)
GFR: 88.8 mL/min (ref >60.00)
Glucose, Bld: 134 mg/dL — ABNORMAL HIGH (ref 70–99)
Potassium: 4.3 mEq/L (ref 3.5–5.1)
Sodium: 141 mEq/L (ref 135–145)
Total Bilirubin: 0.6 mg/dL (ref 0.2–1.2)
Total Protein: 7.3 g/dL (ref 6.0–8.3)

## 2020-07-23 LAB — TSH: TSH: 11.41 u[IU]/mL — ABNORMAL HIGH (ref 0.35–4.50)

## 2020-07-23 LAB — T4, FREE: Free T4: 1.21 ng/dL (ref 0.60–1.60)

## 2020-07-23 LAB — T3: T3, Total: 85 ng/dL (ref 76–181)

## 2020-07-28 ENCOUNTER — Other Ambulatory Visit: Payer: Self-pay | Admitting: Family Medicine

## 2020-07-28 MED ORDER — LEVOTHYROXINE SODIUM 125 MCG PO TABS: 125.0000 ug | ORAL_TABLET | Freq: Every day | ORAL | 1 refills | Status: DC

## 2020-08-01 ENCOUNTER — Ambulatory Visit (INDEPENDENT_AMBULATORY_CARE_PROVIDER_SITE_OTHER): Admitting: Family Medicine

## 2020-08-01 ENCOUNTER — Encounter: Payer: Self-pay | Admitting: Family Medicine

## 2020-08-01 ENCOUNTER — Other Ambulatory Visit: Payer: Self-pay

## 2020-08-01 DIAGNOSIS — E039 Hypothyroidism, unspecified: Secondary | ICD-10-CM

## 2020-08-01 DIAGNOSIS — R030 Elevated blood-pressure reading, without diagnosis of hypertension: Secondary | ICD-10-CM | POA: Diagnosis not present

## 2020-08-01 DIAGNOSIS — R4586 Emotional lability: Secondary | ICD-10-CM | POA: Diagnosis not present

## 2020-08-01 DIAGNOSIS — U071 COVID-19: Secondary | ICD-10-CM

## 2020-08-01 NOTE — Assessment & Plan Note (Signed)
Ongoing albeit improving.  Anticipate combination of thyroid imbalance and COVID illness.  Continue PRN lorazepam.

## 2020-08-01 NOTE — Progress Notes (Signed)
This visit was conducted in person.  BP (!) 162/100 (BP Location: Right Arm, Patient Position: Sitting, Cuff Size: Normal)   Pulse 78   Temp 98.3 F (36.8 C) (Temporal)   Ht 5\' 5"  (1.651 m)   Wt 174 lb 8 oz (79.2 kg)   SpO2 97%   BMI 29.04 kg/m   BP Readings from Last 3 Encounters:  08/01/20 (!) 162/100  07/22/20 124/88  07/09/20 125/79  Elevated on repeat testing.  Home BP this morning 121/80.   CC: f/u hypothyroidism Subjective:    Patient ID: Cheryl Brady, female    DOB: 1961-01-25, 59 y.o.   MRN: [id number]  HPI: Cheryl Brady is a 60 y.o. female presenting on 08/01/2020 for Hypothyroidism (Here for f/u.)   See prior note for details.  Recent positive COVID test early August - largely GI COVID symptoms have largely improved however persistent emotional lability with increased anxiety attacks noted this is despite Rx lorazepam 0.5mg  nightly and PRN. She also noted nausea and diarrhea.   Recent labs showing persistent hypothyroidism so we increased levothyroxine to 174mcg daily with one day a week taking 1.5 tablets. Planned rechecking early October.      Relevant past medical, surgical, family and social history reviewed and updated as indicated. Interim medical history since our last visit reviewed. Allergies and medications reviewed and updated. Outpatient Medications Prior to Visit  Medication Sig Dispense Refill  . ascorbic acid (VITAMIN C) 500 MG tablet Take 1 tablet (500 mg total) by mouth daily.    Marland Kitchen levothyroxine (SYNTHROID) 125 MCG tablet Take 1 tablet (125 mcg total) by mouth daily. One day a week take an extra 1/2 tablet. 120 tablet 1  . LORazepam (ATIVAN) 0.5 MG tablet Take 0.5-1 tablets (0.25-0.5 mg total) by mouth 2 (two) times daily as needed for anxiety or sleep. 20 tablet 0  . Multiple Vitamins-Minerals (MULTIVITAMIN WOMEN) TABS Take 1 tablet by mouth daily.    Marland Kitchen atorvastatin (LIPITOR) 20 MG tablet Take 1 tablet (20 mg total) by mouth daily at 6 PM.  (Patient not taking: Reported on 07/09/2020) 90 tablet 3   No facility-administered medications prior to visit.     Per HPI unless specifically indicated in ROS section below Review of Systems Objective:  BP (!) 162/100 (BP Location: Right Arm, Patient Position: Sitting, Cuff Size: Normal)   Pulse 78   Temp 98.3 F (36.8 C) (Temporal)   Ht 5\' 5"  (1.651 m)   Wt 174 lb 8 oz (79.2 kg)   SpO2 97%   BMI 29.04 kg/m   Wt Readings from Last 3 Encounters:  08/01/20 174 lb 8 oz (79.2 kg)  07/22/20 173 lb (78.5 kg)  07/09/20 175 lb (79.4 kg)      Physical Exam Vitals and nursing note reviewed.  Constitutional:      Appearance: Normal appearance. She is not ill-appearing.  Cardiovascular:     Rate and Rhythm: Normal rate and regular rhythm.     Pulses: Normal pulses.     Heart sounds: Normal heart sounds. No murmur heard.   Pulmonary:     Effort: Pulmonary effort is normal. No respiratory distress.     Breath sounds: Normal breath sounds. No wheezing, rhonchi or rales.  Skin:    Findings: No rash.  Neurological:     Mental Status: She is alert.  Psychiatric:        Mood and Affect: Mood is anxious.       Results for orders  placed or performed in visit on 07/23/20  Comprehensive metabolic panel  Result Value Ref Range   Sodium 141 135 - 145 mEq/L   Potassium 4.3 3.5 - 5.1 mEq/L   Chloride 104 96 - 112 mEq/L   CO2 28 19 - 32 mEq/L   Glucose, Bld 134 (H) 70 - 99 mg/dL   BUN 8 6 - 23 mg/dL   Creatinine, Ser 0.68 0.40 - 1.20 mg/dL   Total Bilirubin 0.6 0.2 - 1.2 mg/dL   Alkaline Phosphatase 65 39 - 117 U/L   AST 15 0 - 37 U/L   ALT 17 0 - 35 U/L   Total Protein 7.3 6.0 - 8.3 g/dL   Albumin 4.5 3.5 - 5.2 g/dL   GFR 88.80 >60.00 mL/min   Calcium 9.9 8.4 - 10.5 mg/dL  TSH  Result Value Ref Range   TSH 11.41 (H) 0.35 - 4.50 uIU/mL  CBC with Differential/Platelet  Result Value Ref Range   WBC 8.4 4.0 - 10.5 K/uL   RBC 4.76 3.87 - 5.11 Mil/uL   Hemoglobin 15.0 12.0 - 15.0  g/dL   HCT 43.5 36 - 46 %   MCV 91.5 78.0 - 100.0 fl   MCHC 34.4 30.0 - 36.0 g/dL   RDW 13.4 11.5 - 15.5 %   Platelets 337.0 150 - 400 K/uL   Neutrophils Relative % 69.9 43 - 77 %   Lymphocytes Relative 21.3 12 - 46 %   Monocytes Relative 7.1 3 - 12 %   Eosinophils Relative 1.1 0 - 5 %   Basophils Relative 0.6 0 - 3 %   Neutro Abs 5.9 1.4 - 7.7 K/uL   Lymphs Abs 1.8 0.7 - 4.0 K/uL   Monocytes Absolute 0.6 0 - 1 K/uL   Eosinophils Absolute 0.1 0 - 0 K/uL   Basophils Absolute 0.0 0 - 0 K/uL  T3  Result Value Ref Range   T3, Total 85 76 - 181 ng/dL  T4, free  Result Value Ref Range   Free T4 1.21 0.60 - 1.60 ng/dL   Assessment & Plan:  This visit occurred during the SARS-CoV-2 public health emergency.  Safety protocols were in place, including screening questions prior to the visit, additional usage of staff PPE, and extensive cleaning of exam room while observing appropriate contact time as indicated for disinfecting solutions.   MyChart text sent to active at home.  Problem List Items Addressed This Visit    White coat syndrome without diagnosis of hypertension    Home BP readings all well controlled. Anticipate white coat hypertension. Advised continue to monitor at home and let me know if home readings staying >140/90.       Hypothyroidism    TSH high, fT4 and T3 normal range.  rec increase levothyroxine 192mcg to 1.5 tablets once a week with 1 tab daily on other days and recheck levels in 6 wks. Pt agrees with plan.       Emotional lability    Ongoing albeit improving.  Anticipate combination of thyroid imbalance and COVID illness.  Continue PRN lorazepam.       COVID-19 virus infection    Discussed recent illness and vaccine booster options.           No orders of the defined types were placed in this encounter.  No orders of the defined types were placed in this encounter.   Patient Instructions  Thyroid was a bit underactive - I recommend increasing  current regimen to 1  pill daily of levothyroxine 169mcg but one day a week taking 1.5 pills. Recheck levels in 6 weeks.  Watch blood pressures at home, let me know if consistently >140/90.  Continue lorazepam as needed.    Follow up plan: Return if symptoms worsen or fail to improve.  [name], MD

## 2020-08-01 NOTE — Assessment & Plan Note (Signed)
Home BP readings all well controlled. Anticipate white coat hypertension. Advised continue to monitor at home and let me know if home readings staying >140/90.

## 2020-08-01 NOTE — Assessment & Plan Note (Signed)
TSH high, fT4 and T3 normal range.  rec increase levothyroxine 158mcg to 1.5 tablets once a week with 1 tab daily on other days and recheck levels in 6 wks. Pt agrees with plan.

## 2020-08-01 NOTE — Assessment & Plan Note (Signed)
Discussed recent illness and vaccine booster options.

## 2020-08-01 NOTE — Patient Instructions (Addendum)
Thyroid was a bit underactive - I recommend increasing current regimen to 1 pill daily of levothyroxine 164mcg but one day a week taking 1.5 pills. Recheck levels in 6 weeks.  Watch blood pressures at home, let me know if consistently >140/90.  Continue lorazepam as needed.

## 2020-08-13 ENCOUNTER — Ambulatory Visit: Admitting: Family Medicine

## 2020-08-15 ENCOUNTER — Other Ambulatory Visit: Payer: Self-pay

## 2020-08-15 ENCOUNTER — Ambulatory Visit (INDEPENDENT_AMBULATORY_CARE_PROVIDER_SITE_OTHER): Admitting: Family Medicine

## 2020-08-15 VITALS — BP 138/90 | HR 92 | Temp 98.4°F | Ht 65.0 in | Wt 169.0 lb

## 2020-08-15 DIAGNOSIS — N816 Rectocele: Secondary | ICD-10-CM | POA: Diagnosis not present

## 2020-08-15 NOTE — Progress Notes (Signed)
Chief Complaint  Patient presents with  . Vaginal Prolapse    x 2 days    History of Present Illness: HPI  59 year old female  Pt of Dr. Danise Mina presents with new onset abnormal feeling in vaginal area.  When you looked noted change in vaginal area.   no dysuria, no change in frequency or urgency.  no fever. No pain with intercourse, no vaginal pain, no vaginal discharge.   S/P TAH  S/P 3 NSVD   No history of prolapse.  Has not had vaginal exam for a while  This visit occurred during the SARS-CoV-2 public health emergency.  Safety protocols were in place, including screening questions prior to the visit, additional usage of staff PPE, and extensive cleaning of exam room while observing appropriate contact time as indicated for disinfecting solutions.   COVID 19 screen:  No recent travel or known exposure to COVID19 The patient denies respiratory symptoms of COVID 19 at this time. The importance of social distancing was discussed today.     Review of Systems  Constitutional: Negative for chills and fever.  HENT: Negative for congestion and ear pain.   Eyes: Negative for pain and redness.  Respiratory: Negative for cough and shortness of breath.   Cardiovascular: Negative for chest pain, palpitations and leg swelling.  Gastrointestinal: Negative for abdominal pain, blood in stool, constipation, diarrhea, nausea and vomiting.  Genitourinary: Negative for dysuria.  Musculoskeletal: Negative for falls and myalgias.  Skin: Negative for rash.  Neurological: Negative for dizziness.  Psychiatric/Behavioral: Negative for depression. The patient is not nervous/anxious.       Past Medical History:  Diagnosis Date  . COVID-19 virus infection 06/2020  . Fibromyalgia 2012   dx by prior PCP  . History of diabetes mellitus 1997-2016   DSME [facility] 03/2013, resolved with 53lb weight loss 2016  . History of iron deficiency anemia 2007  . Hyperlipidemia   . Hypertension   .  Hypothyroid   . Migraines    silent migraines - dizzy and nauseated, photophobia  . Perennial allergic rhinitis     reports that she has quit smoking. She has never used smokeless tobacco. She reports that she does not drink alcohol and does not use drugs.   Current Outpatient Medications:  .  ascorbic acid (VITAMIN C) 500 MG tablet, Take 1 tablet (500 mg total) by mouth daily., Disp:  , Rfl:  .  levothyroxine (SYNTHROID) 125 MCG tablet, Take 1 tablet (125 mcg total) by mouth daily. One day a week take an extra 1/2 tablet., Disp: 120 tablet, Rfl: 1 .  LORazepam (ATIVAN) 0.5 MG tablet, Take 0.5-1 tablets (0.25-0.5 mg total) by mouth 2 (two) times daily as needed for anxiety or sleep., Disp: 20 tablet, Rfl: 0 .  Multiple Vitamins-Minerals (MULTIVITAMIN WOMEN) TABS, Take 1 tablet by mouth daily., Disp: , Rfl:    Observations/Objective: Blood pressure 138/90, pulse 92, temperature 98.4 F (36.9 C), temperature source Temporal, height 5\' 5"  (1.651 m), weight 169 lb (76.7 kg), SpO2 97 %.  Physical Exam Exam conducted with a chaperone present.  Constitutional:      General: She is not in acute distress.    Appearance: Normal appearance. She is well-developed. She is not ill-appearing or toxic-appearing.  HENT:     Head: Normocephalic.     Right Ear: Hearing, tympanic membrane, ear canal and external ear normal. Tympanic membrane is not erythematous, retracted or bulging.     Left Ear: Hearing, tympanic membrane, ear canal  and external ear normal. Tympanic membrane is not erythematous, retracted or bulging.     Nose: No mucosal edema or rhinorrhea.     Right Sinus: No maxillary sinus tenderness or frontal sinus tenderness.     Left Sinus: No maxillary sinus tenderness or frontal sinus tenderness.     Mouth/Throat:     Pharynx: Uvula midline.  Eyes:     General: Lids are normal. Lids are everted, no foreign bodies appreciated.     Conjunctiva/sclera: Conjunctivae normal.     Pupils: Pupils  are equal, round, and reactive to light.  Neck:     Thyroid: No thyroid mass or thyromegaly.     Vascular: No carotid bruit.     Trachea: Trachea normal.  Cardiovascular:     Rate and Rhythm: Normal rate and regular rhythm.     Pulses: Normal pulses.     Heart sounds: Normal heart sounds, S1 normal and S2 normal. No murmur heard.  No friction rub. No gallop.   Pulmonary:     Effort: Pulmonary effort is normal. No tachypnea or respiratory distress.     Breath sounds: Normal breath sounds. No decreased breath sounds, wheezing, rhonchi or rales.  Abdominal:     General: Bowel sounds are normal.     Palpations: Abdomen is soft.     Tenderness: There is no abdominal tenderness.     Hernia: There is no hernia in the left inguinal area or right inguinal area.  Genitourinary:    Exam position: Supine.     Pubic Area: No rash.      Labia:        Right: No rash, tenderness or lesion.        Left: No rash, tenderness or lesion.      Urethra: No prolapse or urethral pain.     Vagina: No signs of injury and foreign body. No vaginal discharge, erythema, tenderness, bleeding, lesions or prolapsed vaginal walls.     Comments: No cervix  Small rectocele and  minor weakness of vaginal wall superiorly.. no prolapse Musculoskeletal:     Cervical back: Normal range of motion and neck supple.  Lymphadenopathy:     Lower Body: No right inguinal adenopathy. No left inguinal adenopathy.  Skin:    General: Skin is warm and dry.     Findings: No rash.  Neurological:     Mental Status: She is alert.  Psychiatric:        Mood and Affect: Mood is not anxious or depressed.        Speech: Speech normal.        Behavior: Behavior normal. Behavior is cooperative.        Thought Content: Thought content normal.        Judgment: Judgment normal.      Assessment and Plan   Rectocele, small Very minimal. Pt asymptomatic Start kegels, no further treatment needed at this time. Pt reassured.     Eliezer Lofts, MD

## 2020-08-15 NOTE — Assessment & Plan Note (Addendum)
Very minimal. Pt asymptomatic Start kegels, no further treatment needed at this time. Pt reassured.

## 2020-08-15 NOTE — Patient Instructions (Signed)
Very minimal.  Start kegels, no further treatment needed at this time.

## 2020-08-28 ENCOUNTER — Ambulatory Visit: Admitting: Obstetrics & Gynecology

## 2020-09-12 ENCOUNTER — Other Ambulatory Visit

## 2020-09-30 ENCOUNTER — Other Ambulatory Visit: Payer: Self-pay | Admitting: Family

## 2020-09-30 DIAGNOSIS — Z1231 Encounter for screening mammogram for malignant neoplasm of breast: Secondary | ICD-10-CM

## 2020-11-01 IMAGING — MG DIGITAL DIAGNOSTIC BILATERAL MAMMOGRAM WITH TOMO AND CAD
8 of 14 series · 8 of 30 positions shown · non-contrast
Comparison: Previous exam(s).

CLINICAL DATA: Follow-up of bilateral breast calcifications.

EXAM:
DIGITAL DIAGNOSTIC BILATERAL MAMMOGRAM WITH CAD AND TOMO

[L ML (1 of 2)]
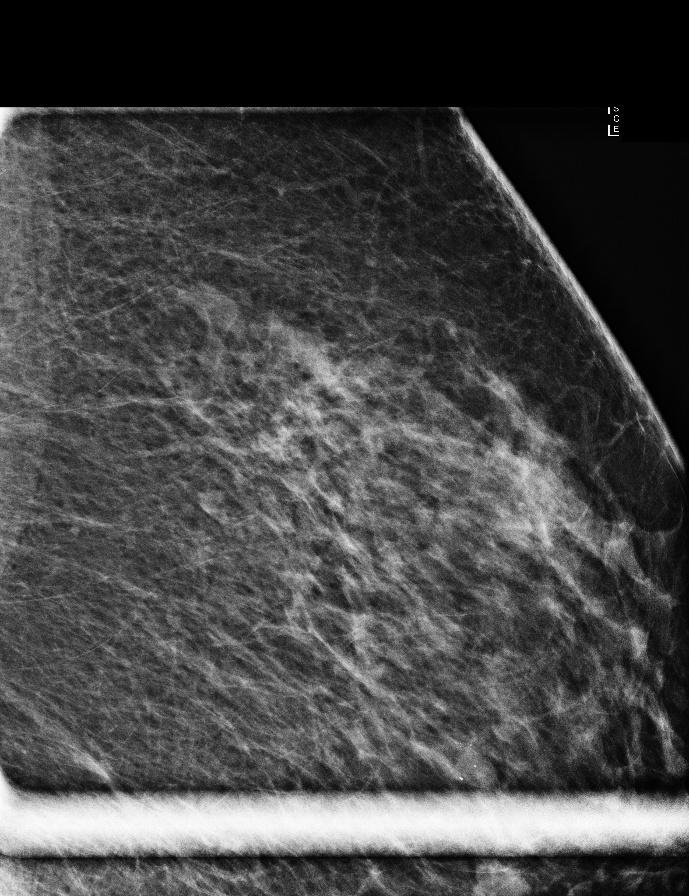

[L ML (2 of 2)]
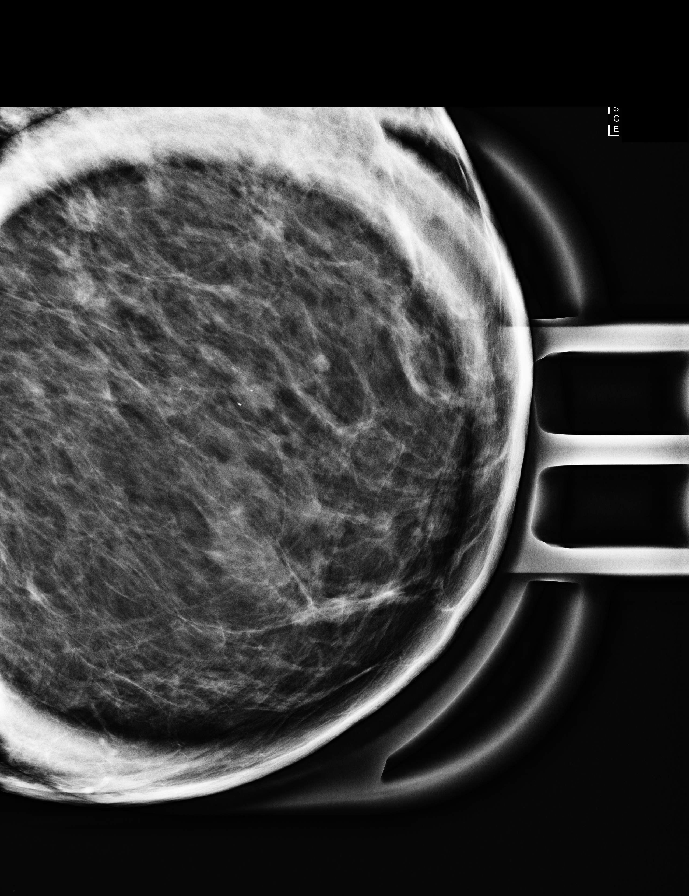

[R ML]
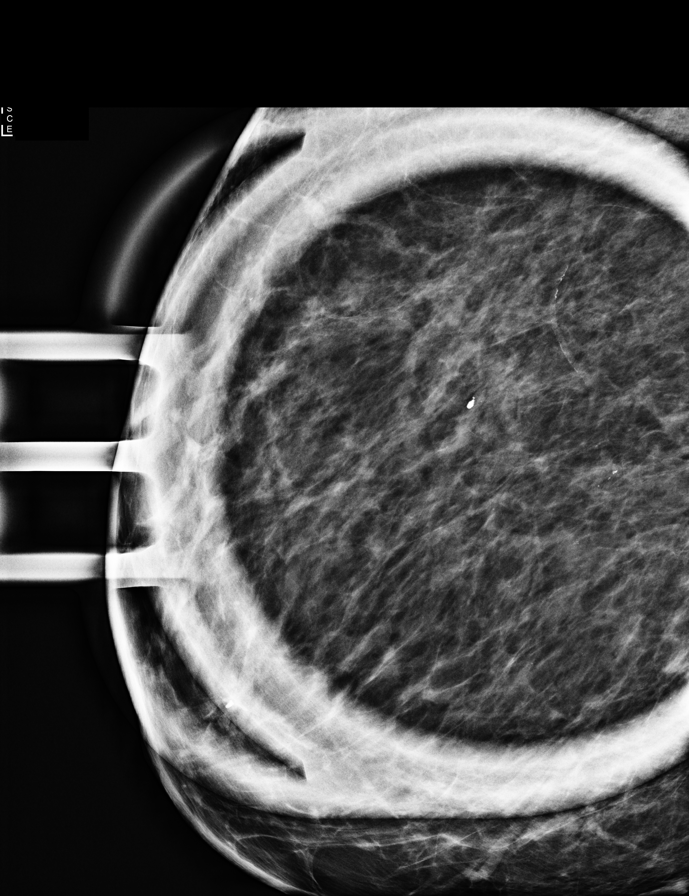

[L CC (1 of 2)]
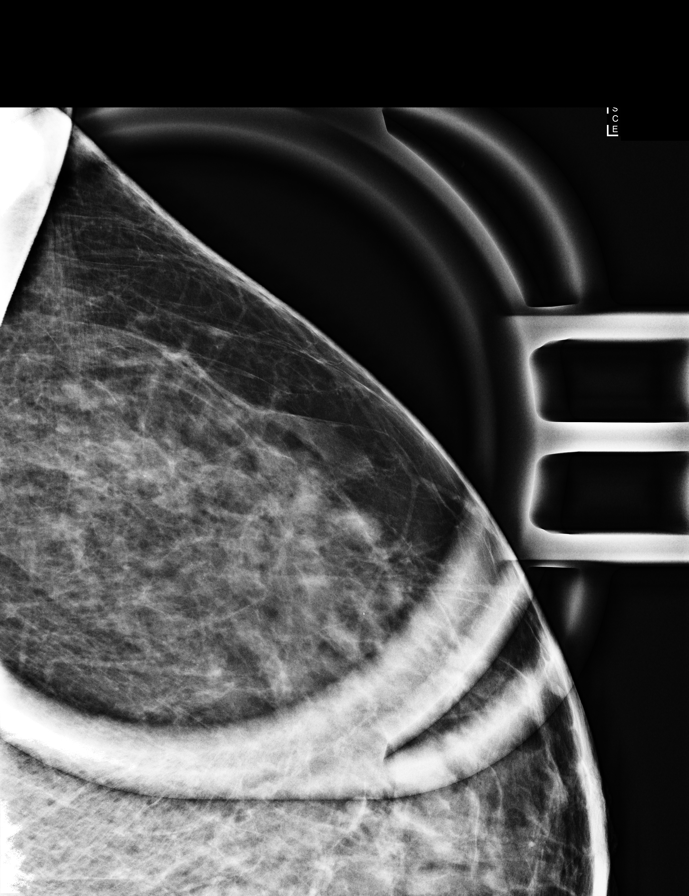

[L CC (2 of 2)]
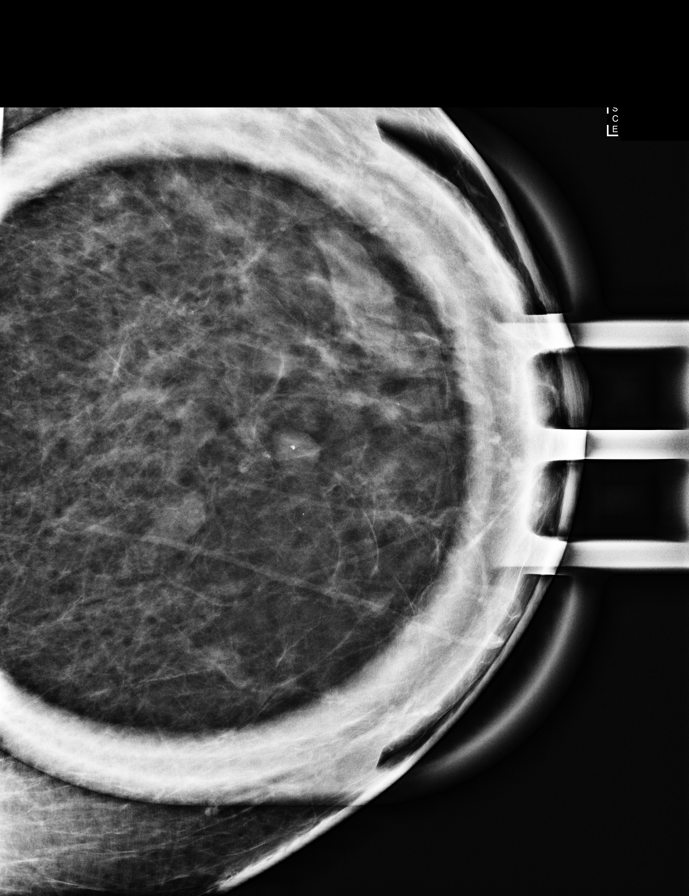

[R CC]
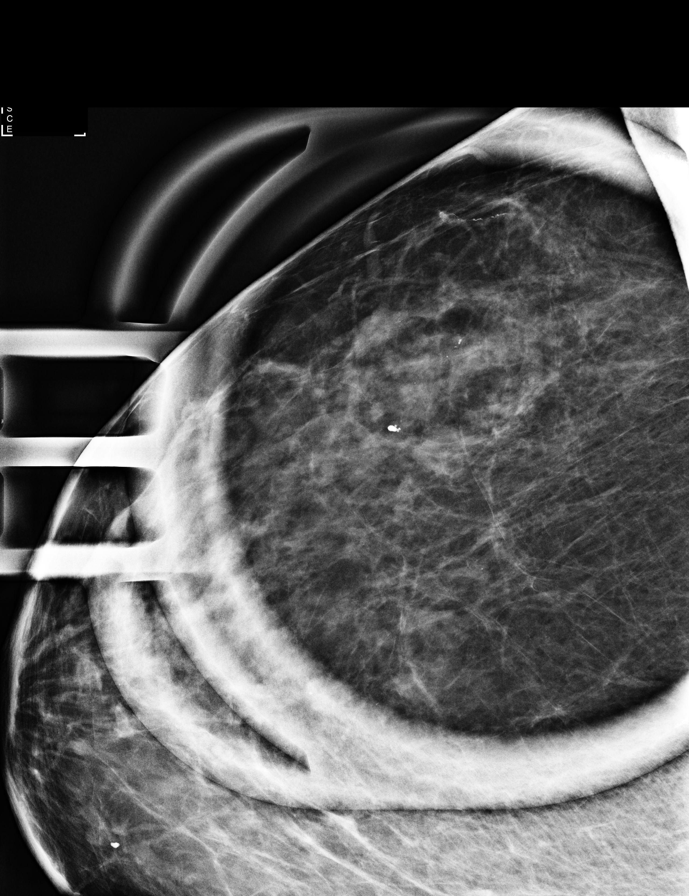

[L MLO synth-2D]
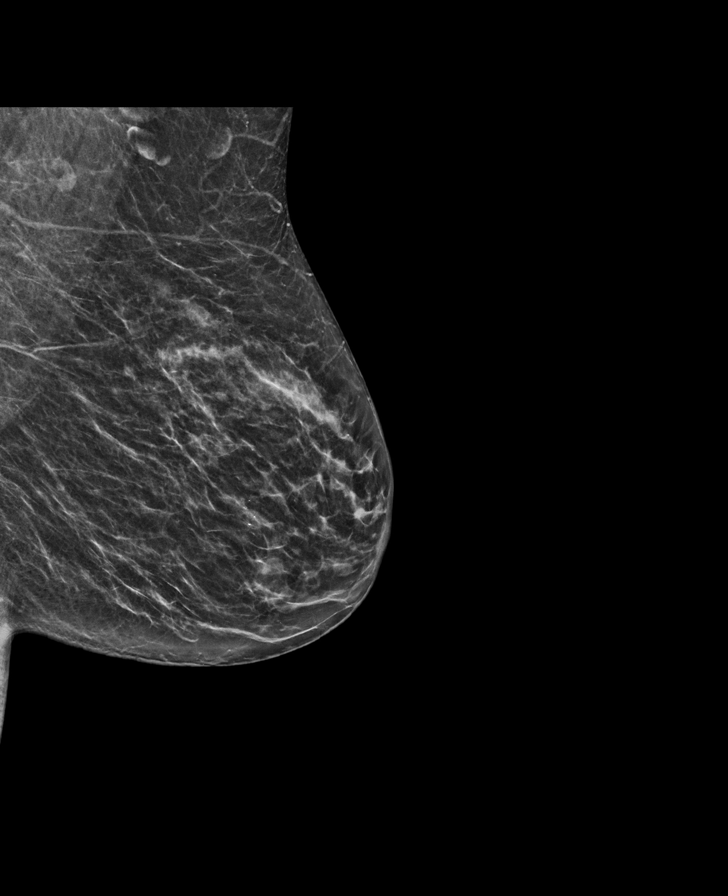

[L CC synth-2D]
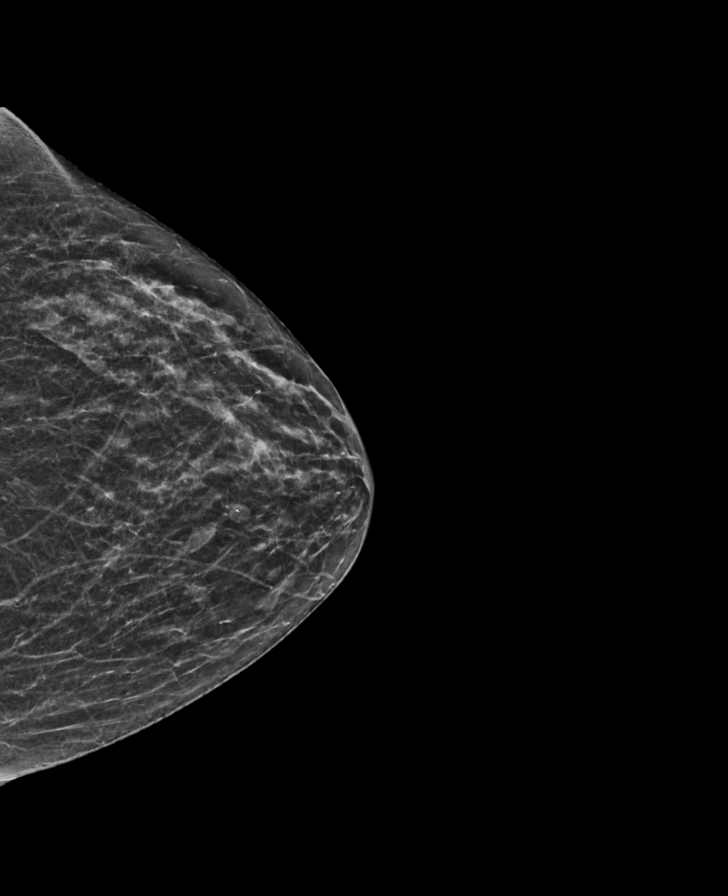

[8 of 30 positions shown; findings below may reference images not displayed]

ACR Breast Density Category c: The breast tissue is heterogeneously
dense, which may obscure small masses.
FINDINGS: The calcifications in the upper outer right breast are stable. The
calcifications in the lateral left breast are no longer visualized.
The calcifications in the central left breast are stable. The
possible masses associated with this group of calcifications on the
left are stable. No other suspicious findings seen in either breast.

Mammographic images were processed with CAD.
IMPRESSION: No mammographic evidence of malignancy.  Benign calcifications.

RECOMMENDATION:
Annual screening mammography.

I have discussed the findings and recommendations with the patient.
Results were also provided in writing at the conclusion of the
visit. If applicable, a reminder letter will be sent to the patient
regarding the next appointment.

BI-RADS CATEGORY  2: Benign.

## 2020-12-10 ENCOUNTER — Other Ambulatory Visit: Payer: Self-pay | Admitting: Family Medicine

## 2021-06-30 ENCOUNTER — Other Ambulatory Visit: Payer: Self-pay | Admitting: Family

## 2021-06-30 ENCOUNTER — Other Ambulatory Visit: Payer: Self-pay | Admitting: Internal Medicine

## 2021-06-30 DIAGNOSIS — Z1231 Encounter for screening mammogram for malignant neoplasm of breast: Secondary | ICD-10-CM

## 2024-09-07 ENCOUNTER — Other Ambulatory Visit: Payer: Self-pay | Admitting: Family

## 2024-09-07 DIAGNOSIS — Z1231 Encounter for screening mammogram for malignant neoplasm of breast: Secondary | ICD-10-CM
